# Patient Record
Sex: Male | Born: 1975
Health system: Southern US, Community
[De-identification: ages and names within clinical notes are randomized; demographics above are authoritative.]

## PROBLEM LIST (undated history)

## (undated) DIAGNOSIS — B019 Varicella without complication: Secondary | ICD-10-CM

## (undated) DIAGNOSIS — T7840XA Allergy, unspecified, initial encounter: Secondary | ICD-10-CM

## (undated) HISTORY — DX: Allergy, unspecified, initial encounter: T78.40XA

## (undated) HISTORY — DX: Varicella without complication: B01.9

---

## 2013-08-26 ENCOUNTER — Encounter (HOSPITAL_COMMUNITY): Payer: Self-pay | Admitting: Emergency Medicine

## 2013-08-26 ENCOUNTER — Emergency Department (INDEPENDENT_AMBULATORY_CARE_PROVIDER_SITE_OTHER)
Admission: EM | Admit: 2013-08-26 | Discharge: 2013-08-26 | Disposition: A | Discharge status: Home / Self Care | Payer: PRIVATE HEALTH INSURANCE | Source: Home / Self Care | Attending: Family Medicine | Admitting: Family Medicine

## 2013-08-26 DIAGNOSIS — M549 Dorsalgia, unspecified: Secondary | ICD-10-CM
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
## 2013-08-27 NOTE — ED Provider Notes (Signed)
Medical screening examination/treatment/procedure(s) were performed by resident physician or non-physician practitioner and as supervising physician I was immediately available for consultation/collaboration.   Barkley Bruns MD.   Linna Hoff, MD 08/27/13 2112

## 2013-10-15 ENCOUNTER — Encounter (HOSPITAL_COMMUNITY): Payer: Self-pay | Admitting: Emergency Medicine

## 2013-10-15 ENCOUNTER — Emergency Department (INDEPENDENT_AMBULATORY_CARE_PROVIDER_SITE_OTHER)
Admission: EM | Admit: 2013-10-15 | Discharge: 2013-10-15 | Disposition: A | Discharge status: Home / Self Care | Payer: PRIVATE HEALTH INSURANCE | Source: Home / Self Care | Attending: Emergency Medicine | Admitting: Emergency Medicine

## 2013-10-15 DIAGNOSIS — S335XXA Sprain of ligaments of lumbar spine, initial encounter: Secondary | ICD-10-CM

## 2013-10-15 DIAGNOSIS — S39012A Strain of muscle, fascia and tendon of lower back, initial encounter: Secondary | ICD-10-CM

## 2013-10-15 MED ORDER — HYDROCODONE-ACETAMINOPHEN 5-325 MG PO TABS: ORAL_TABLET | ORAL | Status: DC

## 2013-10-15 MED ORDER — IBUPROFEN 800 MG PO TABS
800.0000 mg | ORAL_TABLET | Freq: Once | ORAL | Status: AC
  Administered 2013-10-15: 800 mg via ORAL

## 2013-10-15 MED ORDER — MELOXICAM 15 MG PO TABS: 15.0000 mg | ORAL_TABLET | Freq: Every day | ORAL | Status: DC

## 2013-10-15 MED ORDER — CYCLOBENZAPRINE HCL 5 MG PO TABS: 5.0000 mg | ORAL_TABLET | Freq: Three times a day (TID) | ORAL | Status: DC | PRN

## 2013-10-15 MED ORDER — IBUPROFEN 800 MG PO TABS
ORAL_TABLET | ORAL | Status: AC
  Filled 2013-10-15: qty 1
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
## 2015-01-25 ENCOUNTER — Other Ambulatory Visit (HOSPITAL_COMMUNITY): Payer: Self-pay | Admitting: Orthopedic Surgery

## 2015-01-25 DIAGNOSIS — IMO0002 Reserved for concepts with insufficient information to code with codable children: Secondary | ICD-10-CM

## 2015-01-29 ENCOUNTER — Ambulatory Visit (HOSPITAL_COMMUNITY)
Admission: RE | Admit: 2015-01-29 | Discharge: 2015-01-29 | Disposition: A | Discharge status: Home / Self Care | Payer: Self-pay | Source: Ambulatory Visit | Attending: Orthopedic Surgery | Admitting: Orthopedic Surgery

## 2015-01-29 DIAGNOSIS — IMO0002 Reserved for concepts with insufficient information to code with codable children: Secondary | ICD-10-CM

## 2015-02-05 ENCOUNTER — Other Ambulatory Visit (HOSPITAL_COMMUNITY): Payer: Self-pay | Admitting: Orthopedic Surgery

## 2015-02-05 ENCOUNTER — Ambulatory Visit
Admission: RE | Admit: 2015-02-05 | Discharge: 2015-02-05 | Disposition: A | Discharge status: Home / Self Care | Payer: Self-pay | Source: Ambulatory Visit | Attending: Orthopedic Surgery | Admitting: Orthopedic Surgery

## 2015-02-05 ENCOUNTER — Ambulatory Visit: Admission: RE | Admit: 2015-02-05 | Payer: Self-pay | Source: Ambulatory Visit

## 2015-02-05 DIAGNOSIS — IMO0002 Reserved for concepts with insufficient information to code with codable children: Secondary | ICD-10-CM

## 2017-06-05 ENCOUNTER — Ambulatory Visit (INDEPENDENT_AMBULATORY_CARE_PROVIDER_SITE_OTHER): Payer: BLUE CROSS/BLUE SHIELD | Admitting: Primary Care

## 2017-06-05 ENCOUNTER — Encounter: Payer: Self-pay | Admitting: Primary Care

## 2017-06-05 VITALS — BP 134/98 | HR 82 | Temp 98.3°F | Ht 69.0 in | Wt 217.0 lb

## 2017-06-05 DIAGNOSIS — Z0001 Encounter for general adult medical examination with abnormal findings: Secondary | ICD-10-CM | POA: Insufficient documentation

## 2017-06-05 DIAGNOSIS — R030 Elevated blood-pressure reading, without diagnosis of hypertension: Secondary | ICD-10-CM

## 2017-06-05 DIAGNOSIS — Z0000 Encounter for general adult medical examination without abnormal findings: Secondary | ICD-10-CM | POA: Insufficient documentation

## 2017-06-05 HISTORY — DX: Elevated blood-pressure reading, without diagnosis of hypertension: R03.0

## 2017-06-05 NOTE — Patient Instructions (Signed)
Monitor your blood pressure as discussed, it should run less than 135/90.  Start exercising. You should be getting 150 minutes of moderate intensity exercise weekly.  Continue your efforts towards a healthy diet.  Schedule a lab only appointment at your convenience for your labs. Make sure you are fasting 8 hours prior to this appointment. You may have water and black coffee.  Follow up in 1 year for your annual exam or sooner if needed.  It was a pleasure to meet you today! Please don't hesitate to call me with any questions. Welcome to [facility]!

## 2017-06-05 NOTE — Progress Notes (Signed)
Subjective:    Patient ID: Julian Randolph, male    DOB: Oct 15, 1976, 41 y.o.   MRN: [id number]  HPI  Julian Randolph is a 41 year old male who presents today to establish care and for complete physical. Will obtain old records  1) Elevated Blood Pressure Reading: BP of 134/98 in the office today. No prior history of elevated readings in the past. He denies family history of hypertension and heart disease. He is working on weight loss through changes in his diet. He does not currently exercise.  Immunizations: -Tetanus: Completed in 2013 -Influenza: Did not complete last season  Diet: He endorses a poor diet up until 2 weeks ago since cutting out breads, starches, sugars. Breakfast: Skips Lunch: Eggs, bacon, meat, sandwiches Dinner: Steak, chicken, pork, rice, pasta Snacks: Fruit, chips, nuts Desserts: Occasionally Beverages: Water, beer, un-sweet tea  Exercise: He is not currently exercising Eye exam: Completed 3-4 years ago. Some mild changes over time. Dental exam: Has not completed recently   Review of Systems  Constitutional: Negative for unexpected weight change.  HENT: Negative for rhinorrhea.   Respiratory: Negative for cough and shortness of breath.   Cardiovascular: Negative for chest pain.  Gastrointestinal: Negative for constipation and diarrhea.  Genitourinary: Negative for difficulty urinating.  Musculoskeletal: Negative for arthralgias and myalgias.  Skin: Negative for rash.  Allergic/Immunologic: Negative for environmental allergies.  Neurological: Negative for dizziness, numbness and headaches.  Psychiatric/Behavioral:       Denies concerns for anxiety or depression       Past Medical History:  Diagnosis Date  . Allergy   . Chickenpox      Social History   Social History  . Marital status: Married    Spouse name: N/A  . Number of children: N/A  . Years of education: N/A   Occupational History  . Not on file.   Social History Main Topics  .  Smoking status: Former Smoker    Packs/day: 0.50    Types: Cigarettes  . Smokeless tobacco: Current User    Types: Chew  . Alcohol use Yes  . Drug use: No  . Sexual activity: Not on file   Other Topics Concern  . Not on file   Social History Narrative   Married.   2 children.   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   Enjoys spending time with family, golfing.    No past surgical history on file.  No family history on file.  No Known Allergies  No current outpatient prescriptions on file prior to visit.   No current facility-administered medications on file prior to visit.     BP (!) 134/98   Pulse 82   Temp 98.3 F (36.8 C) (Oral)   Ht 5\' 9"  (1.753 m)   Wt 217 lb (98.4 kg)   SpO2 95%   BMI 32.05 kg/m    Objective:   Physical Exam  Constitutional: He is oriented to person, place, and time. He appears well-nourished.  HENT:  Right Ear: Tympanic membrane and ear canal normal.  Left Ear: Tympanic membrane and ear canal normal.  Nose: Nose normal. Right sinus exhibits no maxillary sinus tenderness and no frontal sinus tenderness. Left sinus exhibits no maxillary sinus tenderness and no frontal sinus tenderness.  Mouth/Throat: Oropharynx is clear and moist.  Eyes: Pupils are equal, round, and reactive to light. Conjunctivae and EOM are normal.  Neck: Neck supple. Carotid bruit is not present. No thyromegaly present.  Cardiovascular: Normal rate,  regular rhythm and normal heart sounds.   Pulmonary/Chest: Effort normal and breath sounds normal. He has no wheezes. He has no rales.  Abdominal: Soft. Bowel sounds are normal. There is no tenderness.  Musculoskeletal: Normal range of motion.  Neurological: He is alert and oriented to person, place, and time. He has normal reflexes. No cranial nerve deficit.  Skin: Skin is warm and dry.  Psychiatric: He has a normal mood and affect.          Assessment & Plan:

## 2017-06-05 NOTE — Assessment & Plan Note (Signed)
Immunizations UTD. Discussed the importance of a healthy diet and regular exercise in order for weight loss, and to reduce the risk of other medical problems. Exam unremarkable. Labs pending. Will have him monitor BP readings. Follow up in 1 year for annual exam.

## 2017-06-05 NOTE — Assessment & Plan Note (Signed)
Above goal today, even on recheck. Will have him continue his weight loss efforts through healthy diet, start exercising. He will continue to monitor and notify if no improvement despite weight loss efforts.

## 2017-06-06 ENCOUNTER — Other Ambulatory Visit: Payer: Self-pay | Admitting: Primary Care

## 2017-06-06 ENCOUNTER — Other Ambulatory Visit (INDEPENDENT_AMBULATORY_CARE_PROVIDER_SITE_OTHER): Payer: BLUE CROSS/BLUE SHIELD

## 2017-06-06 DIAGNOSIS — E785 Hyperlipidemia, unspecified: Secondary | ICD-10-CM

## 2017-06-06 DIAGNOSIS — R7989 Other specified abnormal findings of blood chemistry: Secondary | ICD-10-CM | POA: Diagnosis not present

## 2017-06-06 DIAGNOSIS — Z0000 Encounter for general adult medical examination without abnormal findings: Secondary | ICD-10-CM

## 2017-06-06 LAB — COMPREHENSIVE METABOLIC PANEL
ALT: 49 U/L (ref 0–53)
AST: 34 U/L (ref 0–37)
Albumin: 4.4 g/dL (ref 3.5–5.2)
Alkaline Phosphatase: 48 U/L (ref 39–117)
BUN: 12 mg/dL (ref 6–23)
CALCIUM: 9.7 mg/dL (ref 8.4–10.5)
CO2: 27 meq/L (ref 19–32)
Chloride: 101 mEq/L (ref 96–112)
Creatinine, Ser: 1.21 mg/dL (ref 0.40–1.50)
GFR: 69.49 mL/min (ref >60.00)
GLUCOSE: 102 mg/dL — AB (ref 70–99)
Potassium: 4.6 mEq/L (ref 3.5–5.1)
Sodium: 137 mEq/L (ref 135–145)
Total Bilirubin: 1 mg/dL (ref 0.2–1.2)
Total Protein: 7.4 g/dL (ref 6.0–8.3)

## 2017-06-06 LAB — LIPID PANEL
Cholesterol: 210 mg/dL — ABNORMAL HIGH (ref 0–200)
HDL: 34.8 mg/dL — AB (ref >39.00)
Total CHOL/HDL Ratio: 6

## 2017-06-06 LAB — LDL CHOLESTEROL, DIRECT: Direct LDL: 63 mg/dL

## 2017-06-07 ENCOUNTER — Telehealth: Payer: Self-pay | Admitting: Primary Care

## 2017-06-07 NOTE — Telephone Encounter (Signed)
Pt returned missed call 

## 2017-07-20 ENCOUNTER — Other Ambulatory Visit (INDEPENDENT_AMBULATORY_CARE_PROVIDER_SITE_OTHER): Payer: BLUE CROSS/BLUE SHIELD

## 2017-07-20 ENCOUNTER — Other Ambulatory Visit: Payer: Self-pay | Admitting: Primary Care

## 2017-07-20 DIAGNOSIS — E785 Hyperlipidemia, unspecified: Secondary | ICD-10-CM

## 2017-07-20 LAB — LIPID PANEL
Cholesterol: 237 mg/dL — ABNORMAL HIGH (ref 0–200)
HDL: 34.3 mg/dL — AB (ref >39.00)
NonHDL: 202.84
Total CHOL/HDL Ratio: 7
Triglycerides: 362 mg/dL — ABNORMAL HIGH (ref 0.0–149.0)
VLDL: 72.4 mg/dL — ABNORMAL HIGH (ref 0.0–40.0)

## 2017-07-20 LAB — LDL CHOLESTEROL, DIRECT: Direct LDL: 120 mg/dL

## 2017-07-20 LAB — HEMOGLOBIN A1C: HEMOGLOBIN A1C: 5.2 % (ref 4.6–6.5)

## 2017-07-27 ENCOUNTER — Encounter: Payer: Self-pay | Admitting: *Deleted

## 2017-10-30 ENCOUNTER — Ambulatory Visit: Payer: Self-pay | Admitting: Internal Medicine

## 2018-05-09 ENCOUNTER — Other Ambulatory Visit: Payer: Self-pay | Admitting: Primary Care

## 2018-05-09 DIAGNOSIS — E785 Hyperlipidemia, unspecified: Secondary | ICD-10-CM

## 2018-05-17 ENCOUNTER — Encounter: Payer: Self-pay | Admitting: Family Medicine

## 2018-05-17 ENCOUNTER — Ambulatory Visit: Payer: Self-pay | Admitting: Family Medicine

## 2018-05-17 LAB — POCT URINALYSIS DIPSTICK
BILIRUBIN UA: NEGATIVE
Blood, UA: NEGATIVE
GLUCOSE UA: NEGATIVE
Ketones, UA: NEGATIVE
Leukocytes, UA: NEGATIVE
Nitrite, UA: NEGATIVE
Protein, UA: NEGATIVE
SPEC GRAV UA: 1.01 (ref 1.010–1.025)
UROBILINOGEN UA: 0.2 U/dL
pH, UA: 6.5 (ref 5.0–8.0)

## 2018-05-28 NOTE — Progress Notes (Signed)
Erroneous

## 2018-06-04 ENCOUNTER — Telehealth: Payer: Self-pay | Admitting: Primary Care

## 2018-06-04 ENCOUNTER — Other Ambulatory Visit (INDEPENDENT_AMBULATORY_CARE_PROVIDER_SITE_OTHER): Payer: 59

## 2018-06-04 DIAGNOSIS — E785 Hyperlipidemia, unspecified: Secondary | ICD-10-CM | POA: Diagnosis not present

## 2018-06-04 LAB — COMPREHENSIVE METABOLIC PANEL
ALT: 27 U/L (ref 0–53)
AST: 46 U/L — ABNORMAL HIGH (ref 0–37)
Albumin: 4.5 g/dL (ref 3.5–5.2)
Alkaline Phosphatase: 44 U/L (ref 39–117)
BUN: 13 mg/dL (ref 6–23)
CALCIUM: 9.5 mg/dL (ref 8.4–10.5)
CHLORIDE: 100 meq/L (ref 96–112)
CO2: 29 mEq/L (ref 19–32)
Creatinine, Ser: 1.12 mg/dL (ref 0.40–1.50)
GFR: 75.62 mL/min (ref >60.00)
Glucose, Bld: 95 mg/dL (ref 70–99)
Potassium: 4.2 mEq/L (ref 3.5–5.1)
SODIUM: 138 meq/L (ref 135–145)
TOTAL PROTEIN: 7.4 g/dL (ref 6.0–8.3)
Total Bilirubin: 1.3 mg/dL — ABNORMAL HIGH (ref 0.2–1.2)

## 2018-06-04 LAB — LIPID PANEL
CHOLESTEROL: 238 mg/dL — AB (ref 0–200)
HDL: 55.2 mg/dL (ref >39.00)
NonHDL: 182.75
Total CHOL/HDL Ratio: 4
Triglycerides: 223 mg/dL — ABNORMAL HIGH (ref 0.0–149.0)
VLDL: 44.6 mg/dL — AB (ref 0.0–40.0)

## 2018-06-04 LAB — LDL CHOLESTEROL, DIRECT: Direct LDL: 154 mg/dL

## 2018-06-04 NOTE — Telephone Encounter (Signed)
Yes, we can. Please schedule at his convenience. Thanks

## 2018-06-04 NOTE — Telephone Encounter (Signed)
Appointment 7/29

## 2018-06-04 NOTE — Telephone Encounter (Signed)
Pt had cpx labs today and had to r/s his cpx for 7/18.  PEC scheduled this in sept.  Can pt be worked in sooner?

## 2018-06-06 ENCOUNTER — Encounter: Payer: BLUE CROSS/BLUE SHIELD | Admitting: Primary Care

## 2018-06-17 ENCOUNTER — Ambulatory Visit (INDEPENDENT_AMBULATORY_CARE_PROVIDER_SITE_OTHER): Payer: 59 | Admitting: Primary Care

## 2018-06-17 ENCOUNTER — Encounter: Payer: Self-pay | Admitting: Primary Care

## 2018-06-17 VITALS — BP 104/60 | HR 72 | Temp 98.1°F | Ht 68.0 in | Wt 184.0 lb

## 2018-06-17 DIAGNOSIS — Z0000 Encounter for general adult medical examination without abnormal findings: Secondary | ICD-10-CM

## 2018-06-17 DIAGNOSIS — E785 Hyperlipidemia, unspecified: Secondary | ICD-10-CM | POA: Insufficient documentation

## 2018-06-17 DIAGNOSIS — E782 Mixed hyperlipidemia: Secondary | ICD-10-CM | POA: Diagnosis not present

## 2018-06-17 DIAGNOSIS — R030 Elevated blood-pressure reading, without diagnosis of hypertension: Secondary | ICD-10-CM

## 2018-06-17 NOTE — Patient Instructions (Signed)
Congratulations on your weight loss, you look great!  Resume Fish Oil 1000 mg once daily for triglycerides.  Continue exercising. You should be getting 150 minutes of moderate intensity exercise weekly.  Ensure you are consuming 64 ounces of water daily.  Schedule a lab only appointment in 3 months so we can recheck your liver enzymes.  Follow up in 1 year for your annual exam or sooner if needed.  It was a pleasure to see you today!   Preventive Care 40-64 Years, Male Preventive care refers to lifestyle choices and visits with your health care provider that can promote health and wellness. What does preventive care include?  A yearly physical exam. This is also called an annual well check.  Dental exams once or twice a year.  Routine eye exams. Ask your health care provider how often you should have your eyes checked.  Personal lifestyle choices, including: ? Daily care of your teeth and gums. ? Regular physical activity. ? Eating a healthy diet. ? Avoiding tobacco and drug use. ? Limiting alcohol use. ? Practicing safe sex. ? Taking low-dose aspirin every day starting at age 71. What happens during an annual well check? The services and screenings done by your health care provider during your annual well check will depend on your age, overall health, lifestyle risk factors, and family history of disease. Counseling Your health care provider may ask you questions about your:  Alcohol use.  Tobacco use.  Drug use.  Emotional well-being.  Home and relationship well-being.  Sexual activity.  Eating habits.  Work and work Statistician.  Screening You may have the following tests or measurements:  Height, weight, and BMI.  Blood pressure.  Lipid and cholesterol levels. These may be checked every 5 years, or more frequently if you are over 1 years old.  Skin check.  Lung cancer screening. You may have this screening every year starting at age 63 if you have  a 30-pack-year history of smoking and currently smoke or have quit within the past 15 years.  Fecal occult blood test (FOBT) of the stool. You may have this test every year starting at age 44.  Flexible sigmoidoscopy or colonoscopy. You may have a sigmoidoscopy every 5 years or a colonoscopy every 10 years starting at age 34.  Prostate cancer screening. Recommendations will vary depending on your family history and other risks.  Hepatitis C blood test.  Hepatitis B blood test.  Sexually transmitted disease (STD) testing.  Diabetes screening. This is done by checking your blood sugar (glucose) after you have not eaten for a while (fasting). You may have this done every 1-3 years.  Discuss your test results, treatment options, and if necessary, the need for more tests with your health care provider. Vaccines Your health care provider may recommend certain vaccines, such as:  Influenza vaccine. This is recommended every year.  Tetanus, diphtheria, and acellular pertussis (Tdap, Td) vaccine. You may need a Td booster every 10 years.  Varicella vaccine. You may need this if you have not been vaccinated.  Zoster vaccine. You may need this after age 44.  Measles, mumps, and rubella (MMR) vaccine. You may need at least one dose of MMR if you were born in 1957 or later. You may also need a second dose.  Pneumococcal 13-valent conjugate (PCV13) vaccine. You may need this if you have certain conditions and have not been vaccinated.  Pneumococcal polysaccharide (PPSV23) vaccine. You may need one or two doses if you smoke cigarettes  or if you have certain conditions.  Meningococcal vaccine. You may need this if you have certain conditions.  Hepatitis A vaccine. You may need this if you have certain conditions or if you travel or work in places where you may be exposed to hepatitis A.  Hepatitis B vaccine. You may need this if you have certain conditions or if you travel or work in places  where you may be exposed to hepatitis B.  Haemophilus influenzae type b (Hib) vaccine. You may need this if you have certain risk factors.  Talk to your health care provider about which screenings and vaccines you need and how often you need them. This information is not intended to replace advice given to you by your health care provider. Make sure you discuss any questions you have with your health care provider. Document Released: 12/03/2015 Document Revised: 07/26/2016 Document Reviewed: 09/07/2015 Elsevier Interactive Patient Education  [employer].

## 2018-06-17 NOTE — Assessment & Plan Note (Signed)
Td UTD. Commended him on weight loss through diet, did recommend regular exercise.  Exam unremarkable. Labs reviewed. Repeat liver enzymes in 3 months. Cholesterol overall improved. Follow up in 1 year for CPE.

## 2018-06-17 NOTE — Assessment & Plan Note (Signed)
Normotensive today. Suspect secondary to weight loss, commended him on this. Continue to monitor.

## 2018-06-17 NOTE — Assessment & Plan Note (Signed)
LDL worse, HDL improved, Trigs improved. Suspect LDL worse due to ketogenic/high protein diet. Overall improved and will continue to monitor. Recommended to increase aerobic exercise.

## 2018-06-17 NOTE — Progress Notes (Signed)
Subjective:    Patient ID: Julian Randolph, male    DOB: Oct 18, 1976, 42 y.o.   MRN: [id number]  HPI  Julian Randolph is a 42 year old male who presents today for complete physical.  Immunizations: -Tetanus: Completed in 2016 -Influenza: Did not complete last season   Diet: He endorses a healthy diet. Has been doing keto and no carb diet. Breakfast: Skips Lunch: Bacon, eggs, steak, beef, chicken, fish,  Dinner: Meat, vegetables  Snacks: Infrequently, fruit, nuts Desserts: None Beverages: Coffee, water, green tea, several times weekly alcohol  Exercise: He is doing stretching and weights at home, 3 days weekly.  Eye exam: No recent formal exam, using readers.  Dental exam: Completes semi-annually   BP Readings from Last 3 Encounters:  06/17/18 104/60  05/17/18 113/67  06/05/17 (!) 134/98    Wt Readings from Last 3 Encounters:  06/17/18 184 lb (83.5 kg)  05/17/18 182 lb (82.6 kg)  06/05/17 217 lb (98.4 kg)     Review of Systems  Constitutional: Negative for unexpected weight change.  HENT: Negative for rhinorrhea.   Respiratory: Negative for cough and shortness of breath.   Cardiovascular: Negative for chest pain.  Gastrointestinal: Negative for constipation and diarrhea.  Genitourinary: Negative for difficulty urinating.  Musculoskeletal: Negative for arthralgias and myalgias.  Skin: Negative for rash.  Allergic/Immunologic: Negative for environmental allergies.  Neurological: Negative for dizziness, numbness and headaches.  Psychiatric/Behavioral: The patient is not nervous/anxious.        Past Medical History:  Diagnosis Date  . Allergy   . Chickenpox      Social History   Socioeconomic History  . Marital status: Married    Spouse name: Not on file  . Number of children: Not on file  . Years of education: Not on file  . Highest education level: Not on file  Occupational History  . Not on file  Social Needs  . Financial resource strain: Not on  file  . Food insecurity:    Worry: Not on file    Inability: Not on file  . Transportation needs:    Medical: Not on file    Non-medical: Not on file  Tobacco Use  . Smoking status: Former Smoker    Packs/day: 0.50    Types: Cigarettes  . Smokeless tobacco: Current User    Types: Chew  Substance and Sexual Activity  . Alcohol use: Yes  . Drug use: No  . Sexual activity: Not on file  Lifestyle  . Physical activity:    Days per week: Not on file    Minutes per session: Not on file  . Stress: Not on file  Relationships  . Social connections:    Talks on phone: Not on file    Gets together: Not on file    Attends religious service: Not on file    Active member of club or organization: Not on file    Attends meetings of clubs or organizations: Not on file    Relationship status: Not on file  . Intimate partner violence:    Fear of current or ex partner: Not on file    Emotionally abused: Not on file    Physically abused: Not on file    Forced sexual activity: Not on file  Other Topics Concern  . Not on file  Social History Narrative   Married.   2 children.   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   Enjoys spending time with family, golfing.  No past surgical history on file.  No family history on file.  No Known Allergies  No current outpatient medications on file prior to visit.   No current facility-administered medications on file prior to visit.     BP 104/60   Pulse 72   Temp 98.1 F (36.7 C) (Oral)   Ht 5\' 8"  (1.727 m)   Wt 184 lb (83.5 kg)   SpO2 97%   BMI 27.98 kg/m    Objective:   Physical Exam  Constitutional: He is oriented to person, place, and time. He appears well-nourished.  HENT:  Mouth/Throat: No oropharyngeal exudate.  Eyes: Pupils are equal, round, and reactive to light. EOM are normal.  Neck: Neck supple. No thyromegaly present.  Cardiovascular: Normal rate and regular rhythm.  Respiratory: Effort normal and  breath sounds normal.  GI: Soft. Bowel sounds are normal. There is no tenderness.  Musculoskeletal: Normal range of motion.  Neurological: He is alert and oriented to person, place, and time.  Skin: Skin is warm and dry.  Psychiatric: He has a normal mood and affect.           Assessment & Plan:

## 2018-07-09 ENCOUNTER — Ambulatory Visit
Admission: EM | Admit: 2018-07-09 | Discharge: 2018-07-09 | Disposition: A | Discharge status: Home / Self Care | Payer: 59 | Attending: Family Medicine | Admitting: Family Medicine

## 2018-07-09 ENCOUNTER — Other Ambulatory Visit: Payer: Self-pay

## 2018-07-09 ENCOUNTER — Encounter: Payer: Self-pay | Admitting: Emergency Medicine

## 2018-07-09 DIAGNOSIS — L0231 Cutaneous abscess of buttock: Secondary | ICD-10-CM | POA: Diagnosis not present

## 2018-07-09 MED ORDER — DOXYCYCLINE HYCLATE 100 MG PO CAPS: 100.0000 mg | ORAL_CAPSULE | Freq: Two times a day (BID) | ORAL | 0 refills | Status: DC
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
## 2018-07-09 NOTE — ED Provider Notes (Signed)
[facility]   CSN: [id number] Arrival date & time: 07/09/18  1342  History   Chief Complaint Chief Complaint  Patient presents with  . Recurrent Skin Infections   HPI  42 year old male presents with a buttock abscess.   Patient reports that he had a bump on his right buttock for the past 2 months.  He states that he squeezed the area approximately 10 days ago.  It has been worsening since.  He states that it is red, swollen, and painful.  Mild to moderate pain.  Worse when sitting..  No medications or interventions tried.  No known relieving factors.  No other complaints.  Past Medical History:  Diagnosis Date  . Allergy   . Chickenpox    Patient Active Problem List   Diagnosis Date Noted  . Hyperlipidemia 06/17/2018  . Preventative health care 06/05/2017  . Elevated blood pressure reading 06/05/2017   History reviewed. No pertinent surgical history.  Home Medications    Prior to Admission medications   Medication Sig Start Date End Date Taking? Authorizing Provider  doxycycline (VIBRAMYCIN) 100 MG capsule Take 1 capsule (100 mg total) by mouth 2 (two) times daily. 07/09/18   Coral Spikes, DO   Family History Family History  Problem Relation Age of Onset  . Healthy Mother   . Healthy Father    Social History Social History   Tobacco Use  . Smoking status: Former Smoker    Packs/day: 0.50    Types: Cigarettes  . Smokeless tobacco: Former Systems developer    Types: Chew  Substance Use Topics  . Alcohol use: Yes    Comment: socially  . Drug use: No   Allergies   Patient has no known allergies.  Review of Systems Review of Systems  Constitutional: Negative.   Skin:       Right buttock abscess.    Physical Exam Triage Vital Signs ED Triage Vitals  Enc Vitals Group     BP 07/09/18 1352 (!) 114/94     Pulse Rate 07/09/18 1352 91     Resp 07/09/18 1352 16     Temp 07/09/18 1352 98 F (36.7 C)     Temp Source 07/09/18 1352 Oral     SpO2 07/09/18 1352  100 %     Weight 07/09/18 1350 180 lb (81.6 kg)     Height 07/09/18 1350 5\' 9"  (1.753 m)     Head Circumference --      Peak Flow --      Pain Score 07/09/18 1350 4     Pain Loc --      Pain Edu? --      Excl. in [place]? --    Updated Vital Signs BP (!) 114/94 (BP Location: Left Arm)   Pulse 91   Temp 98 F (36.7 C) (Oral)   Resp 16   Ht 5\' 9"  (1.753 m)   Wt 81.6 kg   SpO2 100%   BMI 26.58 kg/m   Visual Acuity Right Eye Distance:   Left Eye Distance:   Bilateral Distance:    Right Eye Near:   Left Eye Near:    Bilateral Near:     Physical Exam  Constitutional: He is oriented to person, place, and time. He appears well-developed. No distress.  HENT:  Head: Normocephalic and atraumatic.  Pulmonary/Chest: Effort normal. No respiratory distress.  Neurological: He is alert and oriented to person, place, and time.  Skin:  Right buttock - 5 x  2.5 cm abscess. Drained with minimal pressure today (moderate purulence).   Psychiatric: He has a normal mood and affect. His behavior is normal.  Nursing note and vitals reviewed.  UC Treatments / Results  Labs (all labs ordered are listed, but only abnormal results are displayed) Labs Reviewed - No data to display  EKG None  Radiology No results found.  Procedures Procedures (including critical care time)  Medications Ordered in UC Medications - No data to display  Initial Impression / Assessment and Plan / UC Course  I have reviewed the triage vital signs and the nursing notes.  Pertinent labs & imaging results that were available during my care of the patient were reviewed by me and considered in my medical decision making (see chart for details).    42 year old male presents with a right buttock abscess.  Area drained today with minimal pressure.  No need for incision and drainage.  Wound dressed.  Treating with doxycycline.  Final Clinical Impressions(s) / UC Diagnoses   Final diagnoses:  Abscess of right buttock     Discharge Instructions   None    ED Prescriptions    Medication Sig Dispense Auth. Provider   doxycycline (VIBRAMYCIN) 100 MG capsule Take 1 capsule (100 mg total) by mouth 2 (two) times daily. 14 capsule Coral Spikes, DO     Controlled Substance Prescriptions [place] Controlled Substance Registry consulted? Not Applicable   Coral Spikes, DO 07/09/18 1457

## 2018-07-31 ENCOUNTER — Encounter: Payer: Self-pay | Admitting: Primary Care

## 2018-09-10 ENCOUNTER — Other Ambulatory Visit: Payer: Self-pay | Admitting: Primary Care

## 2018-09-10 DIAGNOSIS — R945 Abnormal results of liver function studies: Principal | ICD-10-CM

## 2018-09-10 DIAGNOSIS — R7989 Other specified abnormal findings of blood chemistry: Secondary | ICD-10-CM

## 2018-09-17 ENCOUNTER — Other Ambulatory Visit (INDEPENDENT_AMBULATORY_CARE_PROVIDER_SITE_OTHER): Payer: 59

## 2018-09-17 DIAGNOSIS — R945 Abnormal results of liver function studies: Secondary | ICD-10-CM

## 2018-09-17 DIAGNOSIS — R7989 Other specified abnormal findings of blood chemistry: Secondary | ICD-10-CM

## 2018-09-17 LAB — HEPATIC FUNCTION PANEL
ALK PHOS: 43 U/L (ref 39–117)
ALT: 14 U/L (ref 0–53)
AST: 16 U/L (ref 0–37)
Albumin: 4.3 g/dL (ref 3.5–5.2)
Bilirubin, Direct: 0.1 mg/dL (ref 0.0–0.3)
Total Bilirubin: 0.6 mg/dL (ref 0.2–1.2)
Total Protein: 6.8 g/dL (ref 6.0–8.3)

## 2018-10-10 ENCOUNTER — Ambulatory Visit (INDEPENDENT_AMBULATORY_CARE_PROVIDER_SITE_OTHER)
Admission: RE | Admit: 2018-10-10 | Discharge: 2018-10-10 | Disposition: A | Discharge status: Home / Self Care | Payer: 59 | Source: Ambulatory Visit | Attending: Primary Care | Admitting: Primary Care

## 2018-10-10 ENCOUNTER — Ambulatory Visit (INDEPENDENT_AMBULATORY_CARE_PROVIDER_SITE_OTHER): Payer: 59 | Admitting: Primary Care

## 2018-10-10 ENCOUNTER — Encounter: Payer: Self-pay | Admitting: Primary Care

## 2018-10-10 VITALS — BP 110/74 | HR 85 | Temp 98.1°F | Ht 68.0 in | Wt 180.2 lb

## 2018-10-10 DIAGNOSIS — R1032 Left lower quadrant pain: Secondary | ICD-10-CM

## 2018-10-10 DIAGNOSIS — G8929 Other chronic pain: Secondary | ICD-10-CM | POA: Insufficient documentation

## 2018-10-10 LAB — CBC WITH DIFFERENTIAL/PLATELET
Basophils Absolute: 0 10*3/uL (ref 0.0–0.1)
Basophils Relative: 1.1 % (ref 0.0–3.0)
Eosinophils Absolute: 0.1 10*3/uL (ref 0.0–0.7)
Eosinophils Relative: 2.3 % (ref 0.0–5.0)
HCT: 43.4 % (ref 39.0–52.0)
Hemoglobin: 14.9 g/dL (ref 13.0–17.0)
LYMPHS PCT: 32.7 % (ref 12.0–46.0)
Lymphs Abs: 1.5 10*3/uL (ref 0.7–4.0)
MCHC: 34.3 g/dL (ref 30.0–36.0)
MCV: 89 fl (ref 78.0–100.0)
MONOS PCT: 8.3 % (ref 3.0–12.0)
Monocytes Absolute: 0.4 10*3/uL (ref 0.1–1.0)
Neutro Abs: 2.6 10*3/uL (ref 1.4–7.7)
Neutrophils Relative %: 55.6 % (ref 43.0–77.0)
Platelets: 168 10*3/uL (ref 150.0–400.0)
RBC: 4.87 Mil/uL (ref 4.22–5.81)
RDW: 13.4 % (ref 11.5–15.5)
WBC: 4.7 10*3/uL (ref 4.0–10.5)

## 2018-10-10 LAB — BASIC METABOLIC PANEL
BUN: 11 mg/dL (ref 6–23)
CHLORIDE: 101 meq/L (ref 96–112)
CO2: 25 mEq/L (ref 19–32)
CREATININE: 1.07 mg/dL (ref 0.40–1.50)
Calcium: 9.7 mg/dL (ref 8.4–10.5)
GFR: 79.58 mL/min (ref >60.00)
Glucose, Bld: 66 mg/dL — ABNORMAL LOW (ref 70–99)
Potassium: 4.3 mEq/L (ref 3.5–5.1)
Sodium: 138 mEq/L (ref 135–145)

## 2018-10-10 NOTE — Patient Instructions (Signed)
Stop by the lab prior to leaving today. I will notify you of your results once received.   Continue to reduce your alcohol consumption. Increase vegetables, fruit, whole grains, lean protein.   Please notify me if you develop nausea/vomiting, blood in your stools.   It was a pleasure to see you today!

## 2018-10-10 NOTE — Progress Notes (Signed)
Subjective:    Patient ID: Julian Randolph, male    DOB: 12-Aug-1976, 42 y.o.   MRN: [id number]  HPI  Julian Randolph is a 42 year old male who presents today with a chief complaint of abdominal pain.   His pain is located to the LLQ which is more of a discomfort/dull ache that has been present for the last several months. His discomfort is intermittent, daily and denies his discomfort at present. He denies nausea, constipation, diarrhea, bloody stools, unexplained weight loss, heavy lifting, urinary symptoms. He has noticed improvement in symptoms since he's cut back on alcohol consumption.   He has a family history of colon cancer in his maternal uncle who was diagnosed at age 69. He is worried that he may have colon cancer.  Diet currently consists of:  Breakfast: Skips Lunch: Steak, ground beef, fish, eggs, no vegetables or starch Dinner: Steak, ground beef, fish, no vegetables or starch  Snacks: Fruit  Desserts: None Beverages: Water, coffee, beer (6 pack 2-3 times weekly, nothing in the last 2 weeks)   Exercise: He does not exercise regularly.    Review of Systems  Constitutional: Negative for fever and unexpected weight change.  Gastrointestinal: Positive for abdominal pain. Negative for blood in stool, constipation, diarrhea, nausea and vomiting.  Genitourinary: Negative for dysuria, frequency and hematuria.  Neurological: Negative for dizziness.       Past Medical History:  Diagnosis Date  . Allergy   . Chickenpox      Social History   Socioeconomic History  . Marital status: Married    Spouse name: Not on file  . Number of children: Not on file  . Years of education: Not on file  . Highest education level: Not on file  Occupational History  . Not on file  Social Needs  . Financial resource strain: Not on file  . Food insecurity:    Worry: Not on file    Inability: Not on file  . Transportation needs:    Medical: Not on file    Non-medical: Not on file    Tobacco Use  . Smoking status: Former Smoker    Packs/day: 0.50    Types: Cigarettes  . Smokeless tobacco: Former Systems developer    Types: Chew  Substance and Sexual Activity  . Alcohol use: Yes    Comment: socially  . Drug use: No  . Sexual activity: Not on file  Lifestyle  . Physical activity:    Days per week: Not on file    Minutes per session: Not on file  . Stress: Not on file  Relationships  . Social connections:    Talks on phone: Not on file    Gets together: Not on file    Attends religious service: Not on file    Active member of club or organization: Not on file    Attends meetings of clubs or organizations: Not on file    Relationship status: Not on file  . Intimate partner violence:    Fear of current or ex partner: Not on file    Emotionally abused: Not on file    Physically abused: Not on file    Forced sexual activity: Not on file  Other Topics Concern  . Not on file  Social History Narrative   Married.   2 children.   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   Enjoys spending time with family, golfing.    No past surgical history on file.  Family  History  Problem Relation Age of Onset  . Healthy Mother   . Healthy Father   . Colon cancer Maternal Uncle 70    No Known Allergies  No current outpatient medications on file prior to visit.   No current facility-administered medications on file prior to visit.     BP 110/74   Pulse 85   Temp 98.1 F (36.7 C) (Oral)   Ht 5\' 8"  (1.727 m)   Wt 180 lb 4 oz (81.8 kg)   SpO2 97%   BMI 27.41 kg/m    Objective:   Physical Exam  Constitutional: He appears well-nourished.  Neck: Neck supple.  Cardiovascular: Normal rate.  Respiratory: Effort normal.  GI: Soft. Bowel sounds are normal. There is no tenderness.  Skin: Skin is warm and dry.           Assessment & Plan:

## 2018-10-10 NOTE — Assessment & Plan Note (Signed)
Intermittent x 2-3 months. No other symptoms. Exam today unremarkable.   Start with labs and abdominal plain films. LFT's one month ago improved and normal  Doubt colon cancer given lack of cardinal signs/symptoms. He does not appear sickly. Could be MSK related, or possibly some inflammation from ETOH abuse.  Await results.

## 2018-10-25 DIAGNOSIS — R1032 Left lower quadrant pain: Secondary | ICD-10-CM

## 2018-10-29 ENCOUNTER — Ambulatory Visit
Admission: RE | Admit: 2018-10-29 | Discharge: 2018-10-29 | Disposition: A | Discharge status: Home / Self Care | Payer: 59 | Source: Ambulatory Visit | Attending: Primary Care | Admitting: Primary Care

## 2018-10-29 DIAGNOSIS — R1032 Left lower quadrant pain: Secondary | ICD-10-CM

## 2018-10-29 DIAGNOSIS — N329 Bladder disorder, unspecified: Secondary | ICD-10-CM | POA: Diagnosis not present

## 2018-10-29 MED ORDER — IOPAMIDOL (ISOVUE-300) INJECTION 61%
100.0000 mL | Freq: Once | INTRAVENOUS | Status: AC | PRN
  Administered 2018-10-29: 100 mL via INTRAVENOUS

## 2019-11-26 ENCOUNTER — Telehealth (INDEPENDENT_AMBULATORY_CARE_PROVIDER_SITE_OTHER): Payer: Self-pay | Admitting: Primary Care

## 2019-11-26 ENCOUNTER — Encounter: Payer: Self-pay | Admitting: Primary Care

## 2019-11-26 ENCOUNTER — Other Ambulatory Visit: Payer: Self-pay

## 2019-11-26 DIAGNOSIS — R109 Unspecified abdominal pain: Secondary | ICD-10-CM

## 2019-11-26 DIAGNOSIS — G8929 Other chronic pain: Secondary | ICD-10-CM

## 2019-11-26 NOTE — Progress Notes (Signed)
Subjective:    Patient ID: Julian Randolph, male    DOB: 30-Jul-1976, 44 y.o.   MRN: [id number]  HPI   Virtual Visit via Video Note  I connected with Julian Randolph on 11/26/19 at  8:00 AM EST by a video enabled telemedicine application and verified that I am speaking with the correct person using two identifiers.  Location: Patient: Home Provider: Office   I discussed the limitations of evaluation and management by telemedicine and the availability of in person appointments. The patient expressed understanding and agreed to proceed.  We attempted to connect via video but his camera wouldn't allow access. We conducted our call via phone which lasted 11 min and 8 sec.  History of Present Illness:  [name] is a 44 year old male with a history of LLQ abdominal pain, elevated blood pressure reading, hyperlipidemia who presents today with a chief complaint of abdominal pain.  Today he endorses ongoing, daily, "knawing" LUQ and LLQ abdominal pain that has been chronic for over one year. He was last evaluated in December 2019 for same symptoms, work up without obvious cause including CT scan and labs.  Over the last week he's working to eliminate acidic foods and spicy foods and has noticed improvement. He denies bloody stools, unexplained weight loss, nausea, vomiting, fatigue.   He has been taking Pepcid and Carafate intermittently with temporary improvement. He has never undergone colonoscopy.   Observations/Objective:  Alert and oriented. Sounds well. No distress. Speaking in complete sentences.  Assessment and Plan:  Chronic left sided abdominal pain, now seems to me more LUQ rather than LLQ. Work up from 2019 unremarkable. Given LUQ pain we will check for H pylori involvement. No PPI use. Also check CBC and CMP. Referral placed to GI for colonoscopy and potential endoscopy if needed. Await results.   Follow Up Instructions:  Call the main line to set up a lab  appointment as discussed.  You will be contacted regarding your referral to GI.  Please let us know if you have not been contacted within two weeks.   It was a pleasure to see you today! [name], NP-C    I discussed the assessment and treatment plan with the patient. The patient was provided an opportunity to ask questions and all were answered. The patient agreed with the plan and demonstrated an understanding of the instructions.   The patient was advised to call back or seek an in-person evaluation if the symptoms worsen or if the condition fails to improve as anticipated.   [name], NP   Review of Systems  Constitutional: Negative for fever and unexpected weight change.  Respiratory: Negative for shortness of breath.   Cardiovascular: Negative for chest pain.  Gastrointestinal: Positive for abdominal pain. Negative for blood in stool, constipation, diarrhea, nausea and vomiting.  Neurological: Negative for dizziness.       Past Medical History:  Diagnosis Date  . Allergy   . Chickenpox      Social History   Socioeconomic History  . Marital status: Married    Spouse name: Not on file  . Number of children: Not on file  . Years of education: Not on file  . Highest education level: Not on file  Occupational History  . Not on file  Tobacco Use  . Smoking status: Former Smoker    Packs/day: 0.50    Types: Cigarettes  . Smokeless tobacco: Former Systems developer    Types: Chew  Substance and Sexual Activity  .  Alcohol use: Yes    Comment: socially  . Drug use: No  . Sexual activity: Not on file  Other Topics Concern  . Not on file  Social History Narrative   Married.   2 children.   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   Enjoys spending time with family, golfing.   Social Determinants of Health   Financial Resource Strain:   . Difficulty of Paying Living Expenses: Not on file  Food Insecurity:   . Worried About Charity fundraiser in the  Last Year: Not on file  . Ran Out of Food in the Last Year: Not on file  Transportation Needs:   . Lack of Transportation (Medical): Not on file  . Lack of Transportation (Non-Medical): Not on file  Physical Activity:   . Days of Exercise per Week: Not on file  . Minutes of Exercise per Session: Not on file  Stress:   . Feeling of Stress : Not on file  Social Connections:   . Frequency of Communication with Friends and Family: Not on file  . Frequency of Social Gatherings with Friends and Family: Not on file  . Attends Religious Services: Not on file  . Active Member of Clubs or Organizations: Not on file  . Attends Archivist Meetings: Not on file  . Marital Status: Not on file  Intimate Partner Violence:   . Fear of Current or Ex-Partner: Not on file  . Emotionally Abused: Not on file  . Physically Abused: Not on file  . Sexually Abused: Not on file    No past surgical history on file.  Family History  Problem Relation Age of Onset  . Healthy Mother   . Healthy Father   . Colon cancer Maternal Uncle 70    No Known Allergies  No current outpatient medications on file prior to visit.   No current facility-administered medications on file prior to visit.    BP 112/78   Pulse 76   Wt 180 lb (81.6 kg)   BMI 27.37 kg/m    Objective:   Physical Exam  Constitutional: He is oriented to person, place, and time.  Respiratory: Effort normal.  Neurological: He is alert and oriented to person, place, and time.  Psychiatric: He has a normal mood and affect.           Assessment & Plan:

## 2019-11-26 NOTE — Assessment & Plan Note (Signed)
Chronic left sided abdominal pain, now seems to me more LUQ rather than LLQ. Work up from 2019 unremarkable. Given LUQ pain we will check for H pylori involvement. No PPI use. Also check CBC and CMP. Referral placed to GI for colonoscopy and potential endoscopy if needed. Await results.

## 2019-11-26 NOTE — Patient Instructions (Signed)
Call the main line to set up a lab appointment as discussed.  You will be contacted regarding your referral to GI.  Please let us know if you have not been contacted within two weeks.   It was a pleasure to see you today! [name], NP-C

## 2019-11-28 ENCOUNTER — Other Ambulatory Visit (INDEPENDENT_AMBULATORY_CARE_PROVIDER_SITE_OTHER): Payer: Self-pay

## 2019-11-28 DIAGNOSIS — G8929 Other chronic pain: Secondary | ICD-10-CM

## 2019-11-28 DIAGNOSIS — R109 Unspecified abdominal pain: Secondary | ICD-10-CM

## 2019-12-01 LAB — HELICOBACTER PYLORI  SPECIAL ANTIGEN
MICRO NUMBER:: 10022733
SPECIMEN QUALITY: ADEQUATE

## 2019-12-17 NOTE — Telephone Encounter (Signed)
Pt called and scheduled his lab appt for tomorrow. There were no labs ordered. He said Anda Kraft had told him to have labs done. He is late doing it.

## 2019-12-17 NOTE — Telephone Encounter (Signed)
Yes, there are labs ordered and dated from 11/26/19.

## 2019-12-18 ENCOUNTER — Other Ambulatory Visit: Payer: Self-pay

## 2019-12-18 ENCOUNTER — Telehealth: Payer: Self-pay | Admitting: Primary Care

## 2019-12-18 ENCOUNTER — Other Ambulatory Visit (INDEPENDENT_AMBULATORY_CARE_PROVIDER_SITE_OTHER): Payer: No Typology Code available for payment source

## 2019-12-18 DIAGNOSIS — G8929 Other chronic pain: Secondary | ICD-10-CM

## 2019-12-18 DIAGNOSIS — R109 Unspecified abdominal pain: Secondary | ICD-10-CM | POA: Diagnosis not present

## 2019-12-18 LAB — COMPREHENSIVE METABOLIC PANEL
ALT: 16 U/L (ref 0–53)
AST: 15 U/L (ref 0–37)
Albumin: 4.4 g/dL (ref 3.5–5.2)
Alkaline Phosphatase: 46 U/L (ref 39–117)
BUN: 10 mg/dL (ref 6–23)
CO2: 31 mEq/L (ref 19–32)
Calcium: 9.7 mg/dL (ref 8.4–10.5)
Chloride: 104 mEq/L (ref 96–112)
Creatinine, Ser: 1.03 mg/dL (ref 0.40–1.50)
GFR: 77.83 mL/min (ref >60.00)
Glucose, Bld: 101 mg/dL — ABNORMAL HIGH (ref 70–99)
Potassium: 4.6 mEq/L (ref 3.5–5.1)
Sodium: 140 mEq/L (ref 135–145)
Total Bilirubin: 0.7 mg/dL (ref 0.2–1.2)
Total Protein: 6.7 g/dL (ref 6.0–8.3)

## 2019-12-18 LAB — CBC WITH DIFFERENTIAL/PLATELET
Basophils Absolute: 0.1 10*3/uL (ref 0.0–0.1)
Basophils Relative: 1.2 % (ref 0.0–3.0)
Eosinophils Absolute: 0.3 10*3/uL (ref 0.0–0.7)
Eosinophils Relative: 7 % — ABNORMAL HIGH (ref 0.0–5.0)
HCT: 42.8 % (ref 39.0–52.0)
Hemoglobin: 14.5 g/dL (ref 13.0–17.0)
Lymphocytes Relative: 38 % (ref 12.0–46.0)
Lymphs Abs: 1.9 10*3/uL (ref 0.7–4.0)
MCHC: 33.8 g/dL (ref 30.0–36.0)
MCV: 88.4 fl (ref 78.0–100.0)
Monocytes Absolute: 0.4 10*3/uL (ref 0.1–1.0)
Monocytes Relative: 7.8 % (ref 3.0–12.0)
Neutro Abs: 2.3 10*3/uL (ref 1.4–7.7)
Neutrophils Relative %: 46 % (ref 43.0–77.0)
Platelets: 202 10*3/uL (ref 150.0–400.0)
RBC: 4.84 Mil/uL (ref 4.22–5.81)
RDW: 13.5 % (ref 11.5–15.5)
WBC: 5 10*3/uL (ref 4.0–10.5)

## 2019-12-18 NOTE — Telephone Encounter (Signed)
Pt said he will come get it.

## 2019-12-18 NOTE — Telephone Encounter (Signed)
Left message asking pt to call office  I have pt insurance card does he want [place] to mail it to him or him come pick it up.  Up front  In yellow folders  If pt wants it mailed please put in out going mail And document pt called

## 2019-12-19 DIAGNOSIS — G8929 Other chronic pain: Secondary | ICD-10-CM

## 2019-12-19 DIAGNOSIS — R1032 Left lower quadrant pain: Secondary | ICD-10-CM

## 2020-01-05 NOTE — Telephone Encounter (Signed)
[place], can you take a look? 

## 2020-01-29 ENCOUNTER — Ambulatory Visit (INDEPENDENT_AMBULATORY_CARE_PROVIDER_SITE_OTHER): Payer: No Typology Code available for payment source | Admitting: Gastroenterology

## 2020-01-29 ENCOUNTER — Other Ambulatory Visit: Payer: Self-pay

## 2020-01-29 ENCOUNTER — Encounter: Payer: Self-pay | Admitting: Gastroenterology

## 2020-01-29 VITALS — BP 130/74 | HR 76 | Temp 97.5°F | Ht 68.0 in | Wt 192.5 lb

## 2020-01-29 DIAGNOSIS — R1012 Left upper quadrant pain: Secondary | ICD-10-CM

## 2020-01-29 DIAGNOSIS — Z7289 Other problems related to lifestyle: Secondary | ICD-10-CM

## 2020-01-29 DIAGNOSIS — G8929 Other chronic pain: Secondary | ICD-10-CM | POA: Diagnosis not present

## 2020-01-29 DIAGNOSIS — Z789 Other specified health status: Secondary | ICD-10-CM

## 2020-01-29 MED ORDER — OMEPRAZOLE 40 MG PO CPDR: 40.0000 mg | DELAYED_RELEASE_CAPSULE | Freq: Every day | ORAL | 0 refills | Status: DC

## 2020-01-29 NOTE — Progress Notes (Signed)
Cephas Darby, MD [address]  [place]  [place], [place] [address]  Main: [phone]  Fax: [phone]    Gastroenterology Consultation  Referring Provider:     [name], NP Primary Care Physician:  [name], NP Primary Gastroenterologist:  Dr. Cephas Darby Reason for Consultation: Chronic left upper quadrant pain        HPI:   Julian Randolph is a 44 y.o. male referred by Dr. Carlis Abbott, Leticia Penna, NP  for consultation & management of chronic left upper quadrant pain.  Patient reports that he has been experiencing left-sided abdominal pain that has been ongoing for more than a year. He underwent CT abdomen in 2019, which was unremarkable except for small hemangioma of the liver.  His pancreas is normal.  He acknowledges drinking 4 to 5 packs of beer about 3 times a week.  Since onset of pain, he has cut back to 1-2 times a week and he noticed some improvement.  Today, he wants to know if he has ulcer in the stomach causing the pain.  He also acknowledges consuming Goody powder, aspirin related products for the pain which he has stopped about 2 months ago.  His pain gets worse the day after he consumes alcohol.  He denies black stools, nausea, vomiting, hematemesis.  His most recent LFTs are unremarkable.  H. pylori stool antigen was negative He denies any weight loss, loss of appetite  NSAIDs: Goody powder use, aspirin products about 4-5 times a week  Antiplts/Anticoagulants/Anti thrombotics: None  GI Procedures: None He denies family history of GI malignancy  Past Medical History:  Diagnosis Date  . Allergy   . Chickenpox     History reviewed. No pertinent surgical history.   Current Outpatient Medications:  .  omeprazole (PRILOSEC) 40 MG capsule, Take 1 capsule (40 mg total) by mouth daily before breakfast., Disp: 90 capsule, Rfl: 0  Family History  Problem Relation Age of Onset  . Healthy Mother   . Healthy Father   . Colon cancer  Maternal Uncle 77     Social History   Tobacco Use  . Smoking status: Former Smoker    Packs/day: 0.50    Types: Cigarettes  . Smokeless tobacco: Former Systems developer    Types: Chew  Substance Use Topics  . Alcohol use: Yes    Alcohol/week: 3.0 standard drinks    Types: 3 Standard drinks or equivalent per week    Comment: socially  . Drug use: No    Allergies as of 01/29/2020  . (No Known Allergies)    Review of Systems:    All systems reviewed and negative except where noted in HPI.   Physical Exam:  BP 130/74 (BP Location: Left Arm, Patient Position: Sitting, Cuff Size: Normal)   Pulse 76   Temp (!) 97.5 F (36.4 C) (Oral)   Ht 5\' 8"  (1.727 m)   Wt 192 lb 8 oz (87.3 kg)   BMI 29.27 kg/m  No LMP for male patient.  General:   Alert,  Well-developed, well-nourished, pleasant and cooperative in NAD Head:  Normocephalic and atraumatic. Eyes:  Sclera clear, no icterus.   Conjunctiva pink. Ears:  Normal auditory acuity. Nose:  No deformity, discharge, or lesions. Mouth:  No deformity or lesions,oropharynx pink & moist. Neck:  Supple; no masses or thyromegaly. Lungs:  Respirations even and unlabored.  Clear throughout to auscultation.   No wheezes, crackles, or rhonchi. No acute distress. Heart:  Regular rate and  rhythm; no murmurs, clicks, rubs, or gallops. Abdomen:  Normal bowel sounds. Soft, non-tender and non-distended without masses, hepatosplenomegaly or hernias noted.  No guarding or rebound tenderness.   Rectal: Not performed Msk:  Symmetrical without gross deformities. Good, equal movement & strength bilaterally. Pulses:  Normal pulses noted. Extremities:  No clubbing or edema.  No cyanosis. Neurologic:  Alert and oriented x3;  grossly normal neurologically. Skin:  Intact without significant lesions or rashes. No jaundice. Psych:  Alert and cooperative. Normal mood and affect.  Imaging Studies: Reviewed  Assessment and Plan:   Julian Randolph is a 44 y.o. male  with no significant past medical history, history of chronic alcohol use is seen in consultation for chronic left-sided abdominal pain, predominantly in the upper quadrant.  Differentials include intermittent attacks of mild pancreatitis triggered with alcohol intake or peptic ulcer disease or alcoholic gastritis.  H. pylori stool antigen is negative.  Colonic malignancy has to be ruled out as well  Recommendations Complete abstinence from alcohol Completely stop NSAID use Start omeprazole 40 mg daily before breakfast at least for 74month Recommend EGD for further evaluation Also, discussed about screening colonoscopy given his age.  Patient would like to check with his insurance regarding the co-pay for procedures contact my office back   Follow up as needed   Cephas Darby, MD

## 2020-02-02 ENCOUNTER — Telehealth: Payer: Self-pay

## 2020-02-02 ENCOUNTER — Other Ambulatory Visit: Payer: Self-pay

## 2020-02-02 DIAGNOSIS — Z1211 Encounter for screening for malignant neoplasm of colon: Secondary | ICD-10-CM

## 2020-02-02 DIAGNOSIS — G8929 Other chronic pain: Secondary | ICD-10-CM

## 2020-02-02 DIAGNOSIS — R1012 Left upper quadrant pain: Secondary | ICD-10-CM

## 2020-02-02 MED ORDER — NA SULFATE-K SULFATE-MG SULF 17.5-3.13-1.6 GM/177ML PO SOLN: 354.0000 mL | Freq: Once | ORAL | 0 refills | Status: AC

## 2020-02-02 NOTE — Telephone Encounter (Signed)
Patient wife called to find out if we do prior authorizations for the EGD and Colonoscopy. Informed her that we did and then she could call her insurance plan to find out a price. She scheduled patient colonoscopy and EGD for 03/05/2020. Went over instructions with patient wife and sent to [employer] and mailed. Sent prep to pharmacy

## 2020-02-23 ENCOUNTER — Telehealth: Payer: Self-pay | Admitting: Gastroenterology

## 2020-02-23 NOTE — Telephone Encounter (Signed)
Patient called & l/m on v/m asking questions about his co-pay & what time the procedure would be . I have called & l/m @ [phone] for him to return my call.

## 2020-02-24 ENCOUNTER — Telehealth: Payer: Self-pay

## 2020-02-24 NOTE — Telephone Encounter (Signed)
Patient returned call back in regards to rescheduling his colonoscopy due to change in Dr. Verlin Grills scheduled on 03/05/20.  Patient has agreed to reschedule to 03/08/20.  Patient has been advised of COVID test date Thursday 03/04/20 at [place] [facility] between 8am-1pm.  Patient inquired why he needs to have COVID Vaccine-he has been advised this is mandatory to keep his procedure as schedule.  He also inquired about his insurance billing.  He has been asked to contact his insurance provider to discuss how his insurance will cover.  Thanks,  [place], [place]

## 2020-02-24 NOTE — Telephone Encounter (Signed)
LVM for pt to call the office back in regards to rescheduling his 03/05/20 colon w/egd scheduled with Dr. Marius Ditch.  Needs to be rescheduled due to scope schedule change at [facility].  Thanks,  Sharyn Lull, CMA    Schedule Note:Colonoscopy and EGD on 03/05/2020 with Dr. Marius Ditch at [facility]  DX Colonoscopy Screening Z12.11 EGD left upper abdominal pain R10.12 CPT [id number]

## 2020-03-04 ENCOUNTER — Other Ambulatory Visit: Payer: Self-pay

## 2020-03-04 ENCOUNTER — Other Ambulatory Visit
Admission: RE | Admit: 2020-03-04 | Discharge: 2020-03-04 | Disposition: A | Discharge status: Home / Self Care | Payer: No Typology Code available for payment source | Source: Ambulatory Visit | Attending: Gastroenterology | Admitting: Gastroenterology

## 2020-03-04 DIAGNOSIS — Z01812 Encounter for preprocedural laboratory examination: Secondary | ICD-10-CM | POA: Diagnosis not present

## 2020-03-04 DIAGNOSIS — Z20822 Contact with and (suspected) exposure to covid-19: Secondary | ICD-10-CM | POA: Insufficient documentation

## 2020-03-04 LAB — SARS CORONAVIRUS 2 (TAT 6-24 HRS): SARS Coronavirus 2: NEGATIVE

## 2020-03-05 ENCOUNTER — Encounter: Payer: Self-pay | Admitting: Gastroenterology

## 2020-03-08 ENCOUNTER — Other Ambulatory Visit: Payer: Self-pay

## 2020-03-08 ENCOUNTER — Encounter: Payer: Self-pay | Admitting: Gastroenterology

## 2020-03-08 ENCOUNTER — Ambulatory Visit: Payer: No Typology Code available for payment source | Admitting: Anesthesiology

## 2020-03-08 ENCOUNTER — Encounter
Admission: RE | Disposition: A | Discharge status: Home / Self Care | Payer: Self-pay | Source: Home / Self Care | Attending: Gastroenterology

## 2020-03-08 ENCOUNTER — Ambulatory Visit
Admission: RE | Admit: 2020-03-08 | Discharge: 2020-03-08 | Disposition: A | Discharge status: Home / Self Care | Payer: No Typology Code available for payment source | Attending: Gastroenterology | Admitting: Gastroenterology

## 2020-03-08 DIAGNOSIS — Z79899 Other long term (current) drug therapy: Secondary | ICD-10-CM | POA: Insufficient documentation

## 2020-03-08 DIAGNOSIS — Z800 Family history of malignant neoplasm of digestive organs: Secondary | ICD-10-CM | POA: Insufficient documentation

## 2020-03-08 DIAGNOSIS — Z1211 Encounter for screening for malignant neoplasm of colon: Secondary | ICD-10-CM | POA: Insufficient documentation

## 2020-03-08 DIAGNOSIS — R1012 Left upper quadrant pain: Secondary | ICD-10-CM

## 2020-03-08 DIAGNOSIS — G8929 Other chronic pain: Secondary | ICD-10-CM | POA: Insufficient documentation

## 2020-03-08 DIAGNOSIS — R109 Unspecified abdominal pain: Secondary | ICD-10-CM | POA: Diagnosis not present

## 2020-03-08 DIAGNOSIS — D124 Benign neoplasm of descending colon: Secondary | ICD-10-CM | POA: Diagnosis not present

## 2020-03-08 DIAGNOSIS — Z87891 Personal history of nicotine dependence: Secondary | ICD-10-CM | POA: Insufficient documentation

## 2020-03-08 DIAGNOSIS — K5730 Diverticulosis of large intestine without perforation or abscess without bleeding: Secondary | ICD-10-CM | POA: Insufficient documentation

## 2020-03-08 HISTORY — PX: ESOPHAGOGASTRODUODENOSCOPY (EGD) WITH PROPOFOL: SHX5813

## 2020-03-08 HISTORY — PX: COLONOSCOPY WITH PROPOFOL: SHX5780

## 2020-03-08 SURGERY — COLONOSCOPY WITH PROPOFOL
Anesthesia: General

## 2020-03-08 MED ORDER — SODIUM CHLORIDE 0.9 % IV SOLN
INTRAVENOUS | Status: DC
  Administered 2020-03-08: 10:00:00 1000 mL via INTRAVENOUS

## 2020-03-08 MED ORDER — EPHEDRINE 5 MG/ML INJ
INTRAVENOUS | Status: AC
  Filled 2020-03-08: qty 4

## 2020-03-08 MED ORDER — EPHEDRINE SULFATE 50 MG/ML IJ SOLN
INTRAMUSCULAR | Status: DC | PRN
  Administered 2020-03-08: 5 mg via INTRAVENOUS
  Administered 2020-03-08: 10 mg via INTRAVENOUS
  Administered 2020-03-08 (×2): 5 mg via INTRAVENOUS

## 2020-03-08 MED ORDER — PROPOFOL 500 MG/50ML IV EMUL
INTRAVENOUS | Status: AC
  Filled 2020-03-08: qty 50

## 2020-03-08 MED ORDER — LIDOCAINE HCL (PF) 2 % IJ SOLN
INTRAMUSCULAR | Status: AC
  Filled 2020-03-08: qty 5

## 2020-03-08 MED ORDER — LIDOCAINE HCL (CARDIAC) PF 100 MG/5ML IV SOSY
PREFILLED_SYRINGE | INTRAVENOUS | Status: DC | PRN
  Administered 2020-03-08: 50 mg via INTRAVENOUS

## 2020-03-08 MED ORDER — MIDAZOLAM HCL 2 MG/2ML IJ SOLN
INTRAMUSCULAR | Status: AC
  Filled 2020-03-08: qty 2

## 2020-03-08 MED ORDER — MIDAZOLAM HCL 2 MG/2ML IJ SOLN
INTRAMUSCULAR | Status: DC | PRN
  Administered 2020-03-08: 2 mg via INTRAVENOUS

## 2020-03-08 MED ORDER — BUTAMBEN-TETRACAINE-BENZOCAINE 2-2-14 % EX AERO
INHALATION_SPRAY | CUTANEOUS | Status: DC | PRN
  Administered 2020-03-08: 10:00:00 2 via TOPICAL

## 2020-03-08 MED ORDER — FENTANYL CITRATE (PF) 100 MCG/2ML IJ SOLN
INTRAMUSCULAR | Status: DC | PRN
  Administered 2020-03-08: 50 ug via INTRAVENOUS

## 2020-03-08 MED ORDER — FENTANYL CITRATE (PF) 100 MCG/2ML IJ SOLN
INTRAMUSCULAR | Status: AC
  Filled 2020-03-08: qty 2

## 2020-03-08 MED ORDER — PROPOFOL 500 MG/50ML IV EMUL
INTRAVENOUS | Status: DC | PRN
  Administered 2020-03-08: 125 ug/kg/min via INTRAVENOUS

## 2020-03-08 NOTE — Anesthesia Postprocedure Evaluation (Signed)
Anesthesia Post Note  Patient: Julian Randolph  Procedure(s) Performed: COLONOSCOPY WITH PROPOFOL (N/A ) ESOPHAGOGASTRODUODENOSCOPY (EGD) WITH PROPOFOL (N/A )  Patient location during evaluation: Endoscopy Anesthesia Type: General Level of consciousness: awake and alert and oriented Pain management: pain level controlled Vital Signs Assessment: post-procedure vital signs reviewed and stable Respiratory status: spontaneous breathing Cardiovascular status: blood pressure returned to baseline Anesthetic complications: no     Last Vitals:  Vitals:   03/08/20 1034 03/08/20 1044  BP: 108/66 107/73  Pulse: 98 89  Resp: 14 16  Temp:    SpO2: 100% 99%    Last Pain:  Vitals:   03/08/20 1044  TempSrc:   PainSc: 0-No pain                 Keyri Salberg

## 2020-03-08 NOTE — H&P (Signed)
Cephas Darby, MD [address]  [place], [place] [address]  Main: [phone]  Fax: [phone] Pager: [phone]  Primary Care Physician:  [name], NP Primary Gastroenterologist:  Dr. Cephas Darby  Pre-Procedure History & Physical: HPI:  Julian Randolph is a 44 y.o. male is here for an endoscopy and colonoscopy.   Past Medical History:  Diagnosis Date  . Allergy   . Chickenpox     History reviewed. No pertinent surgical history.  Prior to Admission medications   Medication Sig Start Date End Date Taking? Authorizing Provider  omeprazole (PRILOSEC) 40 MG capsule Take 1 capsule (40 mg total) by mouth daily before breakfast. 01/29/20   Lin Landsman, MD    Allergies as of 02/02/2020  . (No Known Allergies)    Family History  Problem Relation Age of Onset  . Healthy Mother   . Healthy Father   . Colon cancer Maternal Uncle 93    Social History   Socioeconomic History  . Marital status: Married    Spouse name: Not on file  . Number of children: Not on file  . Years of education: Not on file  . Highest education level: Not on file  Occupational History  . Not on file  Tobacco Use  . Smoking status: Former Smoker    Packs/day: 0.50    Types: Pipe  . Smokeless tobacco: Former Systems developer    Types: Chew  Substance and Sexual Activity  . Alcohol use: Yes    Alcohol/week: 3.0 standard drinks    Types: 3 Standard drinks or equivalent per week    Comment: socially  . Drug use: No  . Sexual activity: Not on file  Other Topics Concern  . Not on file  Social History Narrative   Married.   2 children.   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   Enjoys spending time with family, golfing.   Social Determinants of Health   Financial Resource Strain:   . Difficulty of Paying Living Expenses:   Food Insecurity:   . Worried About Charity fundraiser in the Last Year:   . Arboriculturist in the Last Year:     Transportation Needs:   . Film/video editor (Medical):   Marland Kitchen Lack of Transportation (Non-Medical):   Physical Activity:   . Days of Exercise per Week:   . Minutes of Exercise per Session:   Stress:   . Feeling of Stress :   Social Connections:   . Frequency of Communication with Friends and Family:   . Frequency of Social Gatherings with Friends and Family:   . Attends Religious Services:   . Active Member of Clubs or Organizations:   . Attends Archivist Meetings:   Marland Kitchen Marital Status:   Intimate Partner Violence:   . Fear of Current or Ex-Partner:   . Emotionally Abused:   Marland Kitchen Physically Abused:   . Sexually Abused:     Review of Systems: See HPI, otherwise negative ROS  Physical Exam: BP 116/86   Pulse 96   Temp (!) 97.1 F (36.2 C) (Tympanic)   Resp 18   Ht 5\' 9"  (1.753 m)   Wt 81.6 kg   SpO2 99%   BMI 26.58 kg/m  General:   Alert,  pleasant and cooperative in NAD Head:  Normocephalic and atraumatic. Neck:  Supple; no masses or thyromegaly. Lungs:  Clear throughout to auscultation.    Heart:  Regular  rate and rhythm. Abdomen:  Soft, nontender and nondistended. Normal bowel sounds, without guarding, and without rebound.   Neurologic:  Alert and  oriented x4;  grossly normal neurologically.  Impression/Plan: Julian Randolph is here for an endoscopy and colonoscopy to be performed for chronic LUQ pain, colon cancer screening  Risks, benefits, limitations, and alternatives regarding  endoscopy and colonoscopy have been reviewed with the patient.  Questions have been answered.  All parties agreeable.   Sherri Sear, MD  03/08/2020, 9:55 AM

## 2020-03-08 NOTE — Op Note (Signed)
[facility] Gastroenterology Patient Name: Julian Randolph Procedure Date: 03/08/2020 9:47 AM MRN: [id number] Account #: [id number] Date of Birth: [birth year] Admit Type: Outpatient Age: 44 Room: [facility] ENDO ROOM 1 Gender: Male Note Status: Finalized Procedure:             Colonoscopy Indications:           Screening for colorectal malignant neoplasm, This is                         the patient's first colonoscopy Providers:             Lin Landsman MD, MD Medicines:             Monitored Anesthesia Care Complications:         No immediate complications. Estimated blood loss: None. Procedure:             Pre-Anesthesia Assessment:                        - Prior to the procedure, a History and Physical was                         performed, and patient medications and allergies were                         reviewed. The patient is competent. The risks and                         benefits of the procedure and the sedation options and                         risks were discussed with the patient. All questions                         were answered and informed consent was obtained.                         Patient identification and proposed procedure were                         verified by the physician, the nurse, the                         anesthesiologist, the anesthetist and the technician                         in the pre-procedure area in the procedure room in the                         endoscopy suite. Mental Status Examination: alert and                         oriented. Airway Examination: normal oropharyngeal                         airway and neck mobility. Respiratory Examination:                         clear to auscultation. CV Examination: normal.  Prophylactic Antibiotics: The patient does not require                         prophylactic antibiotics. Prior Anticoagulants: The                         patient has taken no  previous anticoagulant or                         antiplatelet agents. ASA Grade Assessment: II - A                         patient with mild systemic disease. After reviewing                         the risks and benefits, the patient was deemed in                         satisfactory condition to undergo the procedure. The                         anesthesia plan was to use monitored anesthesia care                         (MAC). Immediately prior to administration of                         medications, the patient was re-assessed for adequacy                         to receive sedatives. The heart rate, respiratory                         rate, oxygen saturations, blood pressure, adequacy of                         pulmonary ventilation, and response to care were                         monitored throughout the procedure. The physical                         status of the patient was re-assessed after the                         procedure.                        After obtaining informed consent, the colonoscope was                         passed under direct vision. Throughout the procedure,                         the patient's blood pressure, pulse, and oxygen                         saturations were monitored continuously. The  Colonoscope was introduced through the anus and                         advanced to the the terminal ileum, with                         identification of the appendiceal orifice and IC                         valve. The colonoscopy was performed without                         difficulty. The patient tolerated the procedure well.                         The quality of the bowel preparation was evaluated                         using the BBPS [facility] Bowel Preparation Scale) with                         scores of: Right Colon = 3, Transverse Colon = 3 and                         Left Colon = 3 (entire mucosa seen well with no                          residual staining, small fragments of stool or opaque                         liquid). The total BBPS score equals 9. Findings:      The perianal and digital rectal examinations were normal. Pertinent       negatives include normal sphincter tone and no palpable rectal lesions.      Multiple diverticula were found in the entire colon.      A 6 mm polyp was found in the descending colon. The polyp was sessile.       The polyp was removed with a cold snare. Resection and retrieval were       complete.      The retroflexed view of the distal rectum and anal verge was normal and       showed no anal or rectal abnormalities.      The exam was otherwise without abnormality. Impression:            - Diverticulosis in the entire examined colon.                        - One 6 mm polyp in the descending colon, removed with                         a cold snare. Resected and retrieved.                        - The distal rectum and anal verge are normal on                         retroflexion view.                        -  The examination was otherwise normal. Recommendation:        - Discharge patient to home (with escort).                        - Resume previous diet today.                        - Continue present medications.                        - Await pathology results.                        - Repeat colonoscopy in 7 years for surveillance. Procedure Code(s):     --- Professional ---                        [phone], Colonoscopy, flexible; with removal of                         tumor(s), polyp(s), or other lesion(s) by snare                         technique Diagnosis Code(s):     --- Professional ---                        Z12.11, Encounter for screening for malignant neoplasm                         of colon                        K63.5, Polyp of colon                        K57.30, Diverticulosis of large intestine without                         perforation or abscess  without bleeding CPT copyright 2019 American Medical Association. All rights reserved. The codes documented in this report are preliminary and upon coder review may  be revised to meet current compliance requirements. Dr. Ulyess Mort Lin Landsman MD, MD 03/08/2020 10:22:27 AM This report has been signed electronically. Number of Addenda: 0 Note Initiated On: 03/08/2020 9:47 AM Scope Withdrawal Time: 0 hours 10 minutes 6 seconds  Total Procedure Duration: 0 hours 14 minutes 7 seconds  Estimated Blood Loss:  Estimated blood loss: none.      [facility]

## 2020-03-08 NOTE — Transfer of Care (Signed)
Immediate Anesthesia Transfer of Care Note  Patient: Julian Randolph  Procedure(s) Performed: COLONOSCOPY WITH PROPOFOL (N/A ) ESOPHAGOGASTRODUODENOSCOPY (EGD) WITH PROPOFOL (N/A )  Patient Location: PACU  Anesthesia Type:General  Level of Consciousness: awake and sedated  Airway & Oxygen Therapy: Patient Spontanous Breathing and Patient connected to nasal cannula oxygen  Post-op Assessment: Report given to RN and Post -op Vital signs reviewed and stable  Post vital signs: Reviewed and stable  Last Vitals:  Vitals Value Taken Time  BP    Temp    Pulse    Resp    SpO2      Last Pain:  Vitals:   03/08/20 0919  TempSrc: Tympanic  PainSc: 0-No pain         Complications: No apparent anesthesia complications

## 2020-03-08 NOTE — Op Note (Signed)
[facility] Gastroenterology Patient Name: Julian Randolph Procedure Date: 03/08/2020 9:47 AM MRN: [id number] Account #: [id number] Date of Birth: 1976-05-08 Admit Type: Outpatient Age: 44 Room: [facility] ENDO ROOM 1 Gender: Male Note Status: Finalized Procedure:             Upper GI endoscopy Indications:           Abdominal pain in the left upper quadrant, , h/o NSAID                         use Providers:             Lin Landsman MD, MD Medicines:             Monitored Anesthesia Care Complications:         No immediate complications. Estimated blood loss: None. Procedure:             Pre-Anesthesia Assessment:                        - Prior to the procedure, a History and Physical was                         performed, and patient medications and allergies were                         reviewed. The patient is competent. The risks and                         benefits of the procedure and the sedation options and                         risks were discussed with the patient. All questions                         were answered and informed consent was obtained.                         Patient identification and proposed procedure were                         verified by the physician, the nurse, the                         anesthesiologist, the anesthetist and the technician                         in the pre-procedure area in the procedure room in the                         endoscopy suite. Mental Status Examination: alert and                         oriented. Airway Examination: normal oropharyngeal                         airway and neck mobility. Respiratory Examination:                         clear to auscultation. CV  Examination: normal.                         Prophylactic Antibiotics: The patient does not require                         prophylactic antibiotics. Prior Anticoagulants: The                         patient has taken no previous  anticoagulant or                         antiplatelet agents. ASA Grade Assessment: II - A                         patient with mild systemic disease. After reviewing                         the risks and benefits, the patient was deemed in                         satisfactory condition to undergo the procedure. The                         anesthesia plan was to use monitored anesthesia care                         (MAC). Immediately prior to administration of                         medications, the patient was re-assessed for adequacy                         to receive sedatives. The heart rate, respiratory                         rate, oxygen saturations, blood pressure, adequacy of                         pulmonary ventilation, and response to care were                         monitored throughout the procedure. The physical                         status of the patient was re-assessed after the                         procedure.                        After obtaining informed consent, the endoscope was                         passed under direct vision. Throughout the procedure,                         the patient's blood pressure, pulse, and oxygen  saturations were monitored continuously. The Endoscope                         was introduced through the mouth, and advanced to the                         second part of duodenum. The upper GI endoscopy was                         accomplished without difficulty. The patient tolerated                         the procedure well. Findings:      The esophagus was normal.      The stomach was normal. Biopsies were taken with a cold forceps for       Helicobacter pylori testing.      The examined duodenum was normal.      A medium non-bleeding diverticulum was found in the second portion of       the duodenum.      Esophagogastric landmarks were identified: the gastroesophageal junction       was found at 40 cm from  the incisors. Impression:            - Normal esophagus.                        - Normal stomach.                        - Normal examined duodenum.                        - No specimens collected. Recommendation:        - Await pathology results.                        - No ibuprofen, naproxen, or other non-steroidal                         anti-inflammatory drugs.                        - Proceed with colonoscopy as scheduled                        See colonoscopy report Procedure Code(s):     --- Professional ---                        [phone], Esophagogastroduodenoscopy, flexible,                         transoral; with biopsy, single or multiple Diagnosis Code(s):     --- Professional ---                        R10.12, Left upper quadrant pain CPT copyright 2019 American Medical Association. All rights reserved. The codes documented in this report are preliminary and upon coder review may  be revised to meet current compliance requirements. Dr. Ulyess Mort Lin Landsman MD, MD 03/08/2020 10:04:59 AM This report has been signed electronically. Number of Addenda: 0 Note Initiated On:  03/08/2020 9:47 AM Estimated Blood Loss:  Estimated blood loss: none.      [facility]

## 2020-03-08 NOTE — Anesthesia Preprocedure Evaluation (Signed)
Anesthesia Evaluation  Patient identified by MRN, date of birth, ID band Patient awake    Reviewed: Allergy & Precautions, NPO status , Patient's Chart, lab work & pertinent test results  Airway Mallampati: II       Dental   Pulmonary former smoker,           Cardiovascular      Neuro/Psych negative neurological ROS  negative psych ROS   GI/Hepatic Neg liver ROS, Chronic abdominal pain   Endo/Other  negative endocrine ROS  Renal/GU negative Renal ROS  negative genitourinary   Musculoskeletal negative musculoskeletal ROS (+)   Abdominal   Peds negative pediatric ROS (+)  Hematology negative hematology ROS (+)   Anesthesia Other Findings Past Medical History: No date: Allergy No date: Chickenpox  Reproductive/Obstetrics                             Anesthesia Physical Anesthesia Plan  ASA: II  Anesthesia Plan: General   Post-op Pain Management:    Induction: Intravenous  PONV Risk Score and Plan:   Airway Management Planned: Nasal Cannula  Additional Equipment:   Intra-op Plan:   Post-operative Plan:   Informed Consent: I have reviewed the patients History and Physical, chart, labs and discussed the procedure including the risks, benefits and alternatives for the proposed anesthesia with the patient or authorized representative who has indicated his/her understanding and acceptance.     Dental advisory given  Plan Discussed with: CRNA and Surgeon  Anesthesia Plan Comments:         Anesthesia Quick Evaluation

## 2020-03-09 LAB — SURGICAL PATHOLOGY

## 2020-03-10 ENCOUNTER — Encounter: Payer: Self-pay | Admitting: *Deleted

## 2020-03-10 ENCOUNTER — Encounter: Payer: Self-pay | Admitting: Gastroenterology

## 2020-03-14 IMAGING — DX DG ABDOMEN 2V
3 series · 3 of 3 positions shown · non-contrast
Comparison: None.

CLINICAL DATA: Left lower quadrant abdominal pain over the last few
months.

EXAM:
ABDOMEN - 2 VIEW

[abdomen erect]
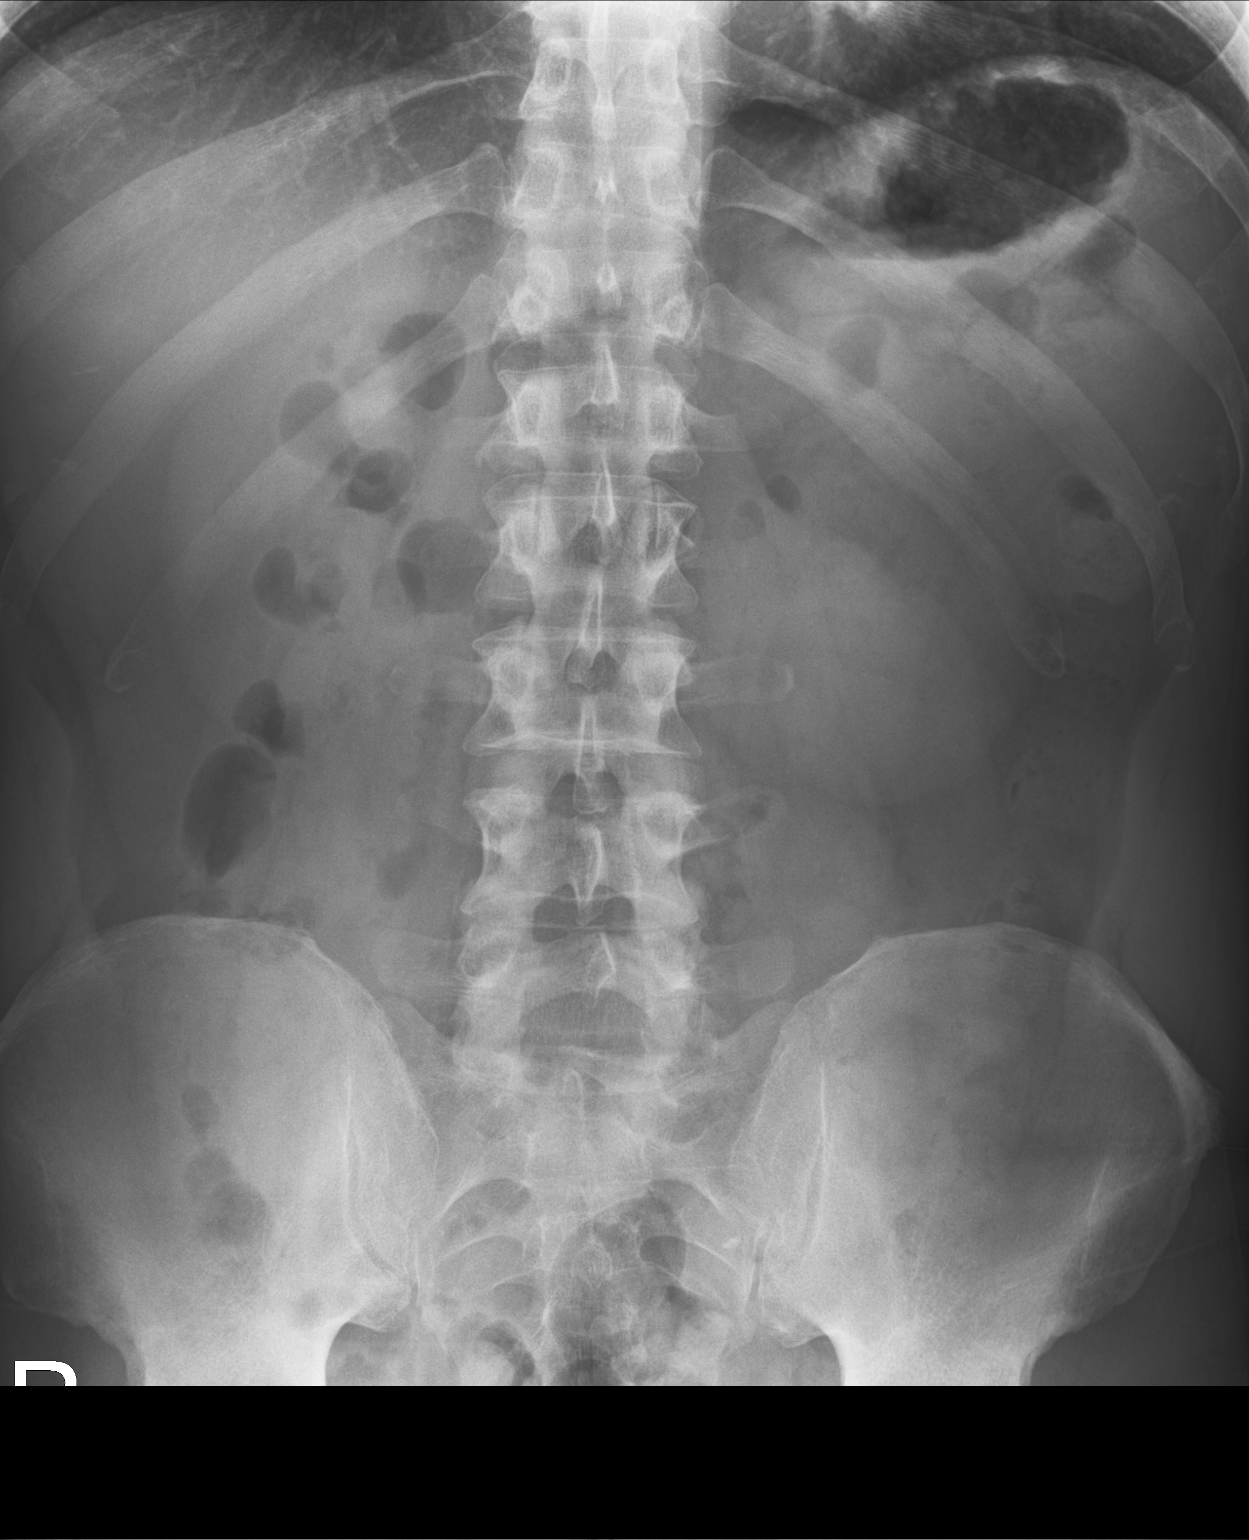

[abdomen supine (1 of 2)]
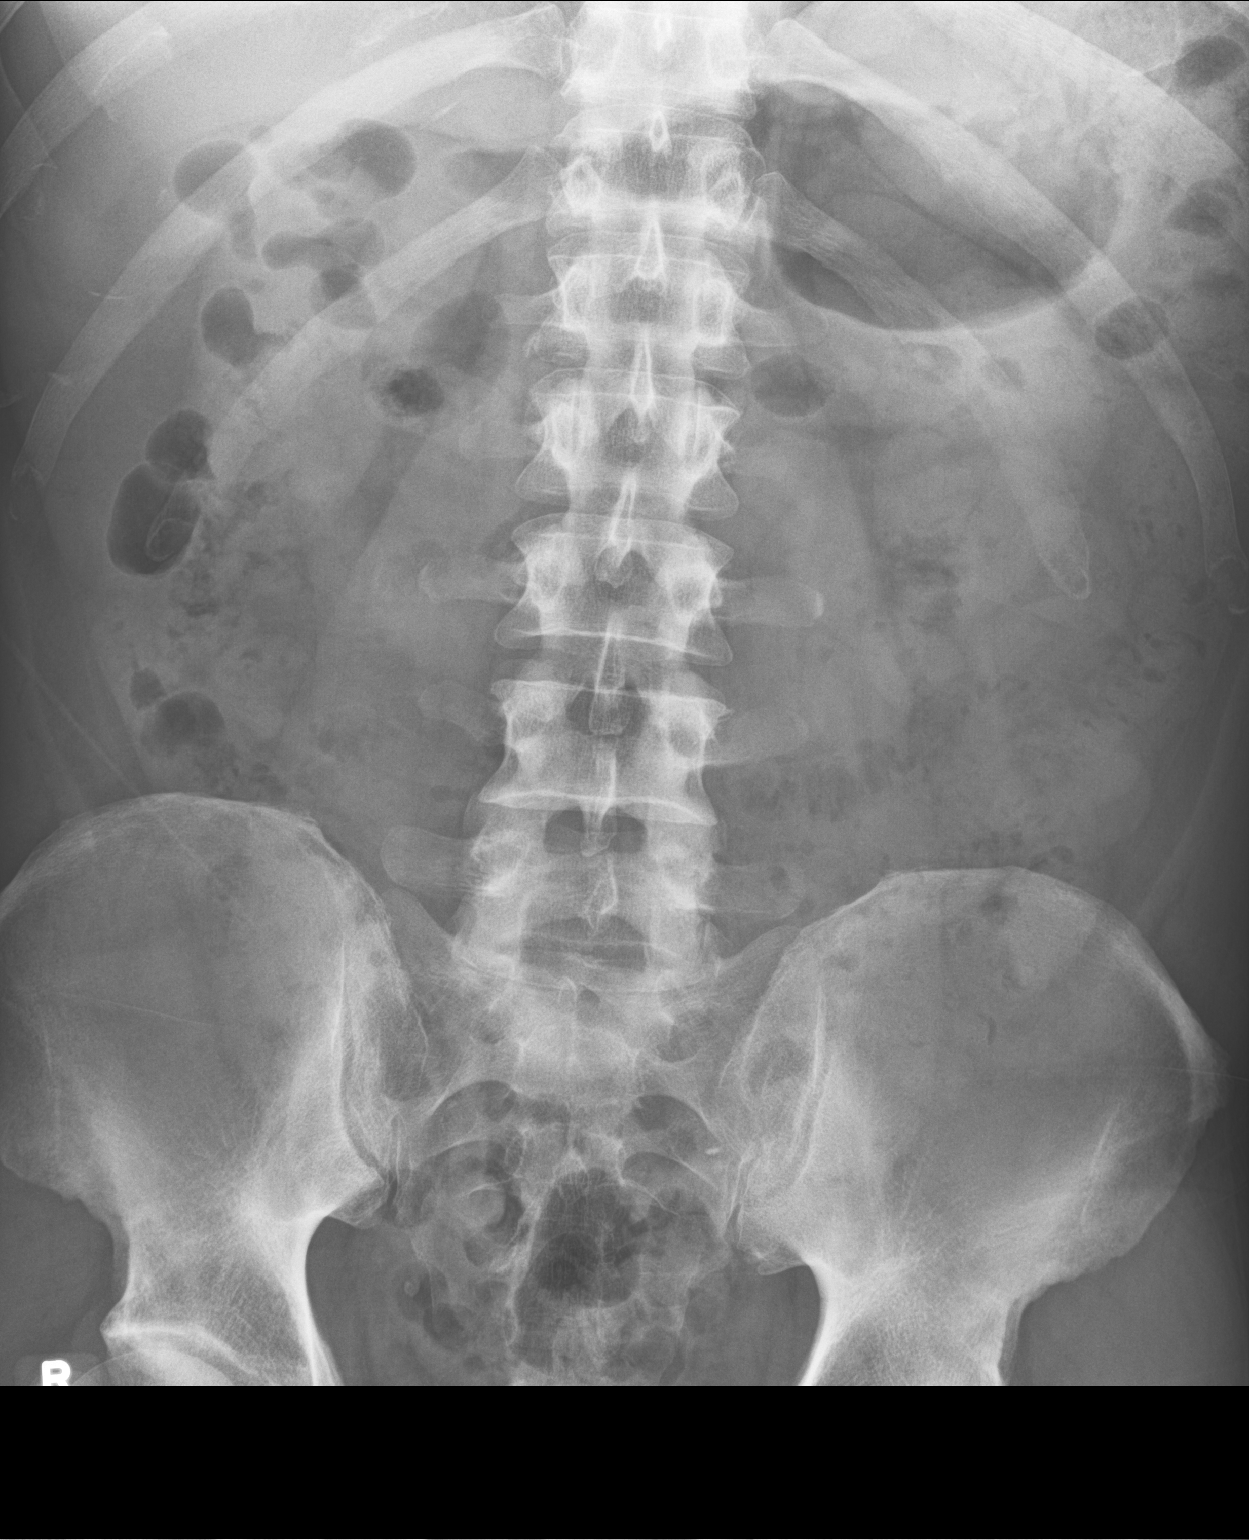

[abdomen supine (2 of 2)]
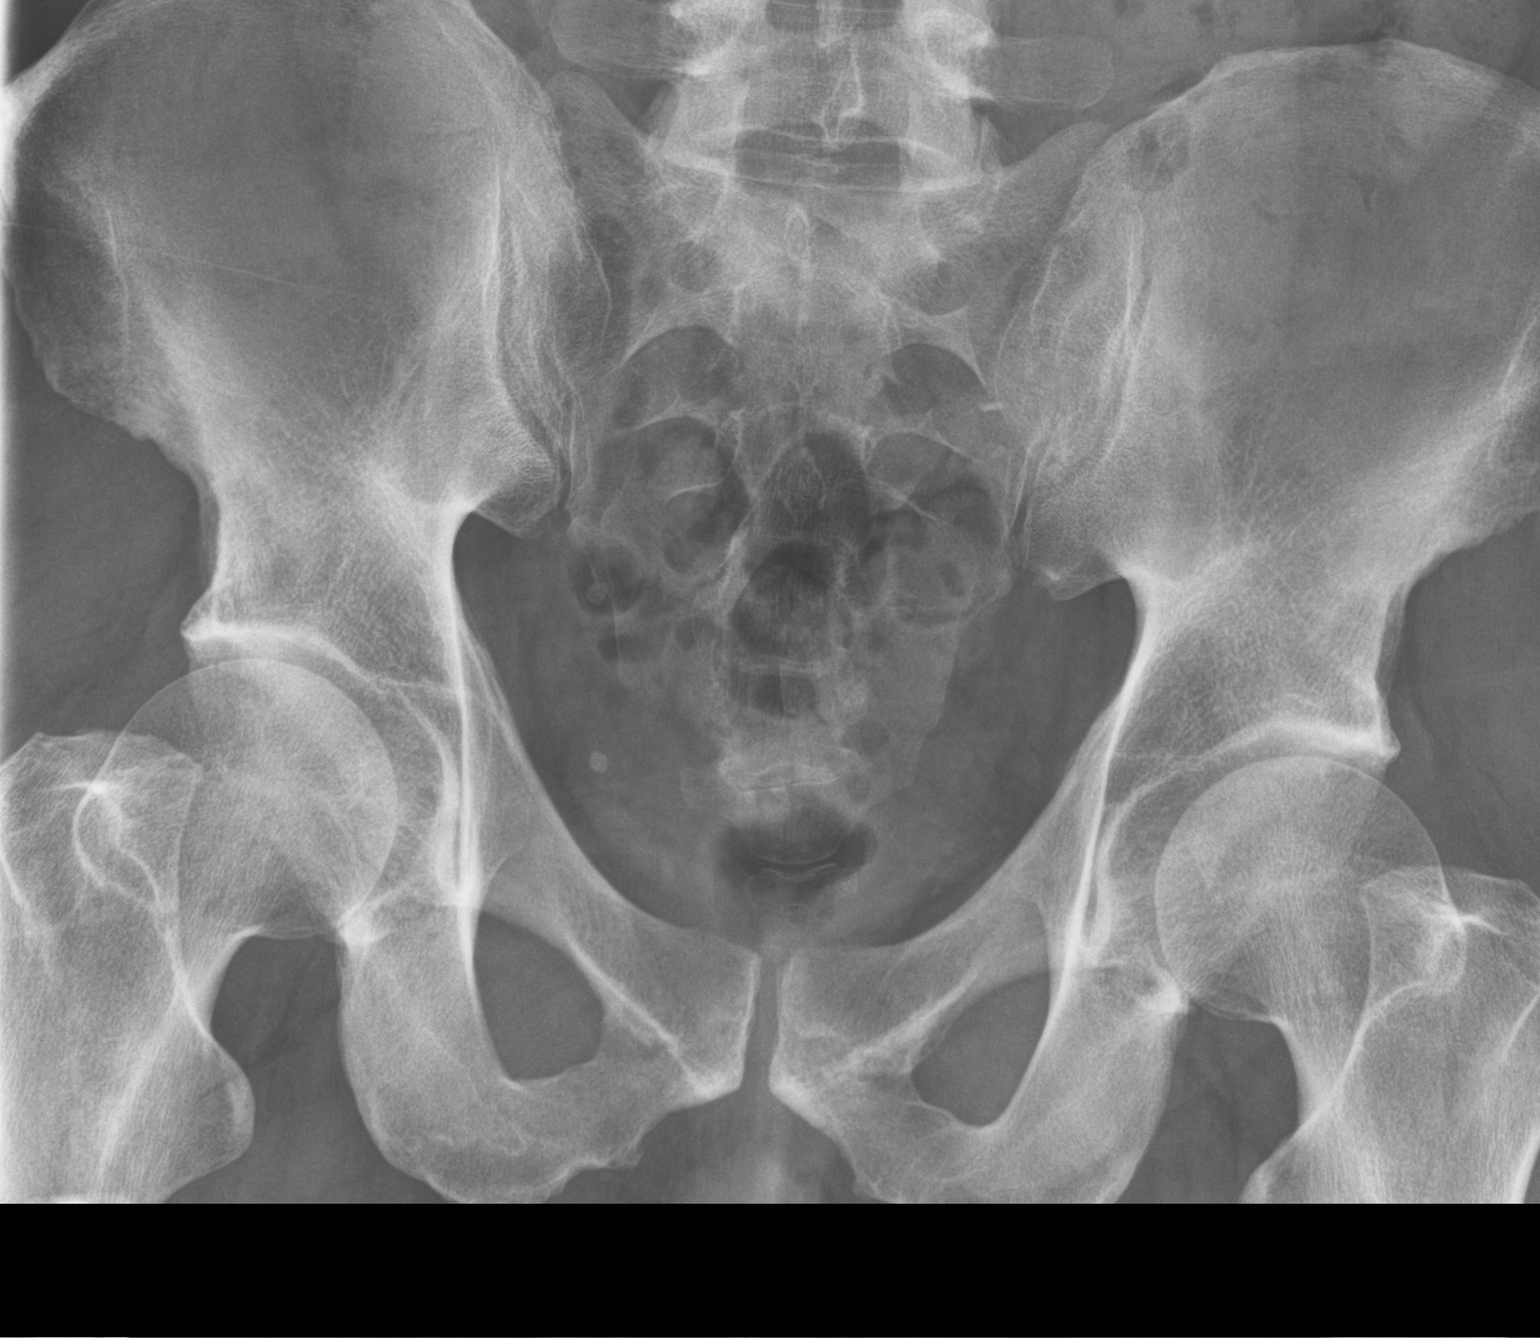

[3 of 3 positions shown; findings below may reference images not displayed]

FINDINGS: The bowel gas pattern is normal. There is no evidence of free air.
No radio-opaque calculi or other significant radiographic
abnormality is seen.
IMPRESSION: Negative.

## 2020-12-21 ENCOUNTER — Encounter: Payer: No Typology Code available for payment source | Admitting: Primary Care

## 2021-01-07 ENCOUNTER — Encounter: Payer: No Typology Code available for payment source | Admitting: Primary Care

## 2021-01-07 ENCOUNTER — Ambulatory Visit: Payer: No Typology Code available for payment source | Admitting: Primary Care

## 2021-01-07 ENCOUNTER — Telehealth: Payer: Self-pay

## 2021-01-07 DIAGNOSIS — Z0289 Encounter for other administrative examinations: Secondary | ICD-10-CM

## 2021-01-07 NOTE — Telephone Encounter (Signed)
[facility] Night - Client Nonclinical Telephone Record AccessNurse Client [facility] Night - Client Client Site [place] - Night Contact Type Call Who Is Calling Patient / Member / Family / Caregiver Caller Name [name] Caller Phone Number [phone] Patient Name Julian Randolph Patient DOB [birth year] Call Type Message Only Information Provided Reason for Call Request to Reschedule Office Appointment Initial Comment Caller has an appt with Loma Boston [phone] am and he needing to reschedule Disp. Time Disposition Final User 01/06/2021 5:51:19 PM General Information Provided Yes Kenton Kingfisher, Lanette Call Closed By: Nelia Shi Transaction Date/Time: 01/06/2021 5:49:03 PM (ET)

## 2021-01-10 NOTE — Telephone Encounter (Signed)
Called patient and schedule for CPE. 01/20/21

## 2021-01-20 ENCOUNTER — Ambulatory Visit: Payer: No Typology Code available for payment source | Admitting: Primary Care

## 2021-01-26 ENCOUNTER — Encounter: Payer: Self-pay | Admitting: Primary Care

## 2021-01-26 ENCOUNTER — Other Ambulatory Visit: Payer: Self-pay

## 2021-01-26 ENCOUNTER — Ambulatory Visit (INDEPENDENT_AMBULATORY_CARE_PROVIDER_SITE_OTHER): Payer: No Typology Code available for payment source | Admitting: Primary Care

## 2021-01-26 VITALS — BP 124/72 | HR 78 | Temp 97.6°F | Ht 69.0 in | Wt 184.0 lb

## 2021-01-26 DIAGNOSIS — Z0000 Encounter for general adult medical examination without abnormal findings: Secondary | ICD-10-CM | POA: Diagnosis not present

## 2021-01-26 DIAGNOSIS — E782 Mixed hyperlipidemia: Secondary | ICD-10-CM

## 2021-01-26 LAB — CBC WITH DIFFERENTIAL/PLATELET
Basophils Absolute: 0 10*3/uL (ref 0.0–0.1)
Basophils Relative: 0.4 % (ref 0.0–3.0)
Eosinophils Absolute: 0.1 10*3/uL (ref 0.0–0.7)
Eosinophils Relative: 1.5 % (ref 0.0–5.0)
HCT: 40.8 % (ref 39.0–52.0)
Hemoglobin: 14 g/dL (ref 13.0–17.0)
Lymphocytes Relative: 21.7 % (ref 12.0–46.0)
Lymphs Abs: 1.1 10*3/uL (ref 0.7–4.0)
MCHC: 34.2 g/dL (ref 30.0–36.0)
MCV: 87.8 fl (ref 78.0–100.0)
Monocytes Absolute: 0.4 10*3/uL (ref 0.1–1.0)
Monocytes Relative: 7.8 % (ref 3.0–12.0)
Neutro Abs: 3.6 10*3/uL (ref 1.4–7.7)
Neutrophils Relative %: 68.6 % (ref 43.0–77.0)
Platelets: 188 10*3/uL (ref 150.0–400.0)
RBC: 4.65 Mil/uL (ref 4.22–5.81)
RDW: 13.5 % (ref 11.5–15.5)
WBC: 5.2 10*3/uL (ref 4.0–10.5)

## 2021-01-26 LAB — COMPREHENSIVE METABOLIC PANEL
ALT: 14 U/L (ref 0–53)
AST: 18 U/L (ref 0–37)
Albumin: 4.3 g/dL (ref 3.5–5.2)
Alkaline Phosphatase: 37 U/L — ABNORMAL LOW (ref 39–117)
BUN: 10 mg/dL (ref 6–23)
CO2: 29 mEq/L (ref 19–32)
Calcium: 9.5 mg/dL (ref 8.4–10.5)
Chloride: 102 mEq/L (ref 96–112)
Creatinine, Ser: 1.06 mg/dL (ref 0.40–1.50)
GFR: 83.95 mL/min (ref >60.00)
Glucose, Bld: 89 mg/dL (ref 70–99)
Potassium: 4.2 mEq/L (ref 3.5–5.1)
Sodium: 137 mEq/L (ref 135–145)
Total Bilirubin: 1 mg/dL (ref 0.2–1.2)
Total Protein: 7.1 g/dL (ref 6.0–8.3)

## 2021-01-26 LAB — LIPID PANEL
Cholesterol: 191 mg/dL (ref 0–200)
HDL: 44.3 mg/dL (ref >39.00)
LDL Cholesterol: 119 mg/dL — ABNORMAL HIGH (ref 0–99)
NonHDL: 146.3
Total CHOL/HDL Ratio: 4
Triglycerides: 138 mg/dL (ref 0.0–149.0)
VLDL: 27.6 mg/dL (ref 0.0–40.0)

## 2021-01-26 NOTE — Assessment & Plan Note (Signed)
Commended him on a healthy lifestyle, repeat the panel pending.

## 2021-01-26 NOTE — Patient Instructions (Signed)
Stop by the lab prior to leaving today. I will notify you of your results once received.   Continue to work on a healthy diet. Ensure you are consuming 64 ounces of water daily.  Continue regular exercise.  It was a pleasure to see you today!   Preventive Care 47-47 Years Old, Male Preventive care refers to lifestyle choices and visits with your health care provider that can promote health and wellness. This includes:  A yearly physical exam. This is also called an annual wellness visit.  Regular dental and eye exams.  Immunizations.  Screening for certain conditions.  Healthy lifestyle choices, such as: ? Eating a healthy diet. ? Getting regular exercise. ? Not using drugs or products that contain nicotine and tobacco. ? Limiting alcohol use. What can I expect for my preventive care visit? Physical exam Your health care provider will check your:  Height and weight. These may be used to calculate your BMI (body mass index). BMI is a measurement that tells if you are at a healthy weight.  Heart rate and blood pressure.  Body temperature.  Skin for abnormal spots. Counseling Your health care provider may ask you questions about your:  Past medical problems.  Family's medical history.  Alcohol, tobacco, and drug use.  Emotional well-being.  Home life and relationship well-being.  Sexual activity.  Diet, exercise, and sleep habits.  Work and work Statistician.  Access to firearms. What immunizations do I need? Vaccines are usually given at various ages, according to a schedule. Your health care provider will recommend vaccines for you based on your age, medical history, and lifestyle or other factors, such as travel or where you work.   What tests do I need? Blood tests  Lipid and cholesterol levels. These may be checked every 5 years, or more often if you are over 60 years old.  Hepatitis C test.  Hepatitis B test. Screening  Lung cancer screening. You  may have this screening every year starting at age 53 if you have a 30-pack-year history of smoking and currently smoke or have quit within the past 15 years.  Prostate cancer screening. Recommendations will vary depending on your family history and other risks.  Genital exam to check for testicular cancer or hernias.  Colorectal cancer screening. ? All adults should have this screening starting at age 38 and continuing until age 50. ? Your health care provider may recommend screening at age 11 if you are at increased risk. ? You will have tests every 1-10 years, depending on your results and the type of screening test.  Diabetes screening. ? This is done by checking your blood sugar (glucose) after you have not eaten for a while (fasting). ? You may have this done every 1-3 years.  STD (sexually transmitted disease) testing, if you are at risk. Follow these instructions at home: Eating and drinking  Eat a diet that includes fresh fruits and vegetables, whole grains, lean protein, and low-fat dairy products.  Take vitamin and mineral supplements as recommended by your health care provider.  Do not drink alcohol if your health care provider tells you not to drink.  If you drink alcohol: ? Limit how much you have to 0-2 drinks a day. ? Be aware of how much alcohol is in your drink. In the [place] one drink equals one 12 oz bottle of beer (355 mL), one 5 oz glass of wine (148 mL), or one 1 oz glass of hard liquor (44 mL).  Lifestyle  Take daily care of your teeth and gums. Brush your teeth every morning and night with fluoride toothpaste. Floss one time each day.  Stay active. Exercise for at least 30 minutes 5 or more days each week.  Do not use any products that contain nicotine or tobacco, such as cigarettes, e-cigarettes, and chewing tobacco. If you need help quitting, ask your health care provider.  Do not use drugs.  If you are sexually active, practice safe sex. Use a condom  or other form of protection to prevent STIs (sexually transmitted infections).  If told by your health care provider, take low-dose aspirin daily starting at age 37.  Find healthy ways to cope with stress, such as: ? Meditation, yoga, or listening to music. ? Journaling. ? Talking to a trusted person. ? Spending time with friends and family. Safety  Always wear your seat belt while driving or riding in a vehicle.  Do not drive: ? If you have been drinking alcohol. Do not ride with someone who has been drinking. ? When you are tired or distracted. ? While texting.  Wear a helmet and other protective equipment during sports activities.  If you have firearms in your house, make sure you follow all gun safety procedures. What's next?  Go to your health care provider once a year for an annual wellness visit.  Ask your health care provider how often you should have your eyes and teeth checked.  Stay up to date on all vaccines. This information is not intended to replace advice given to you by your health care provider. Make sure you discuss any questions you have with your health care provider. Document Revised: 08/05/2019 Document Reviewed: 10/31/2018 Elsevier Patient Education  2021 [employer].

## 2021-01-26 NOTE — Assessment & Plan Note (Signed)
Declines influenza and Covid vaccines.  Tetanus vaccine up-to-date.  Commended him on a healthy diet and regular exercise.  Encouraged to continue.  Exam today unremarkable. Labs pending.

## 2021-01-26 NOTE — Progress Notes (Signed)
Subjective:    Patient ID: Julian Randolph, male    DOB: December 06, 1975, 45 y.o.   MRN: [id number]  HPI  Tajuan Dufault a very pleasant 45 y.o. male who presents today for complete physical.   Immunizations: -Tetanus: 2016 -Influenza: Declines  -Covid-19: Declines    Diet: He endorses a healthy diet.  Exercise: Regular exercise at home  Eye exam: Completes annually  Dental exam: Completes semi-annually   Colonoscopy: Completed in 2021, due in 2026  BP Readings from Last 3 Encounters:  01/26/21 124/72  03/08/20 107/73  01/29/20 130/74     Review of Systems  Constitutional: Negative for unexpected weight change.  HENT: Negative for rhinorrhea.   Respiratory: Negative for cough and shortness of breath.   Cardiovascular: Negative for chest pain.  Gastrointestinal: Negative for constipation and diarrhea.  Genitourinary: Negative for difficulty urinating.  Musculoskeletal: Negative for arthralgias and myalgias.  Skin: Negative for rash.  Allergic/Immunologic: Negative for environmental allergies.  Neurological: Negative for dizziness, numbness and headaches.  Psychiatric/Behavioral: The patient is not nervous/anxious.          Past Medical History:  Diagnosis Date  . Allergy   . Chickenpox     Social History   Socioeconomic History  . Marital status: Married    Spouse name: Not on file  . Number of children: Not on file  . Years of education: Not on file  . Highest education level: Not on file  Occupational History  . Not on file  Tobacco Use  . Smoking status: Former Smoker    Packs/day: 0.50    Types: Pipe  . Smokeless tobacco: Former Systems developer    Types: Secondary school teacher  . Vaping Use: Never used  Substance and Sexual Activity  . Alcohol use: Yes    Alcohol/week: 3.0 standard drinks    Types: 3 Standard drinks or equivalent per week    Comment: socially  . Drug use: No  . Sexual activity: Not on file  Other Topics Concern  . Not on file  Social  History Narrative   Married.   2 children.   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   Enjoys spending time with family, golfing.   Social Determinants of Health   Financial Resource Strain: Not on file  Food Insecurity: Not on file  Transportation Needs: Not on file  Physical Activity: Not on file  Stress: Not on file  Social Connections: Not on file  Intimate Partner Violence: Not on file    Past Surgical History:  Procedure Laterality Date  . COLONOSCOPY WITH PROPOFOL N/A 03/08/2020   Procedure: COLONOSCOPY WITH PROPOFOL;  Surgeon: Lin Landsman, MD;  Location: [facility] ENDOSCOPY;  Service: Gastroenterology;  Laterality: N/A;  . ESOPHAGOGASTRODUODENOSCOPY (EGD) WITH PROPOFOL N/A 03/08/2020   Procedure: ESOPHAGOGASTRODUODENOSCOPY (EGD) WITH PROPOFOL;  Surgeon: Lin Landsman, MD;  Location: [facility] ENDOSCOPY;  Service: Gastroenterology;  Laterality: N/A;    Family History  Problem Relation Age of Onset  . Healthy Mother   . Healthy Father   . Colon cancer Maternal Uncle 70    No Known Allergies  No current outpatient medications on file prior to visit.   No current facility-administered medications on file prior to visit.    BP 124/72   Pulse 78   Temp 97.6 F (36.4 C) (Temporal)   Ht 5\' 9"  (1.753 m)   Wt 184 lb (83.5 kg)   SpO2 98%   BMI 27.17 kg/m  Objective:   Physical  Exam        Assessment & Plan:      This visit occurred during the SARS-CoV-2 public health emergency.  Safety protocols were in place, including screening questions prior to the visit, additional usage of staff PPE, and extensive cleaning of exam room while observing appropriate contact time as indicated for disinfecting solutions.

## 2021-04-19 ENCOUNTER — Ambulatory Visit
Admission: EM | Admit: 2021-04-19 | Discharge: 2021-04-19 | Disposition: A | Discharge status: Home / Self Care | Payer: No Typology Code available for payment source | Attending: Emergency Medicine | Admitting: Emergency Medicine

## 2021-04-19 ENCOUNTER — Other Ambulatory Visit: Payer: Self-pay

## 2021-04-19 DIAGNOSIS — L237 Allergic contact dermatitis due to plants, except food: Secondary | ICD-10-CM | POA: Diagnosis not present

## 2021-04-19 MED ORDER — METHYLPREDNISOLONE SODIUM SUCC 125 MG IJ SOLR
80.0000 mg | Freq: Once | INTRAMUSCULAR | Status: AC
  Administered 2021-04-19: 80 mg via INTRAMUSCULAR

## 2021-04-19 MED ORDER — PREDNISONE 10 MG (21) PO TBPK: ORAL_TABLET | Freq: Every day | ORAL | 0 refills | Status: DC

## 2021-04-19 NOTE — ED Provider Notes (Signed)
Roderic Palau    CSN: [id number] Arrival date & time: 04/19/21  1135      History   Chief Complaint Chief Complaint  Patient presents with  . Poison Ivy    HPI Julian Randolph is a 45 y.o. male.   Patient presents with pruritic rash on his arms and abdomen x2 weeks.  The rash started after he came in contact with poison oak or ivy.  Treatment attempted at home with topical cream.  He denies fever, chills, or other symptoms.  No rash in his eyes or mouth.   The history is provided by the patient.    Past Medical History:  Diagnosis Date  . Allergy   . Chickenpox     Patient Active Problem List   Diagnosis Date Noted  . Chronic abdominal pain 10/10/2018  . Hyperlipidemia 06/17/2018  . Preventative health care 06/05/2017  . Elevated blood pressure reading 06/05/2017    Past Surgical History:  Procedure Laterality Date  . COLONOSCOPY WITH PROPOFOL N/A 03/08/2020   Procedure: COLONOSCOPY WITH PROPOFOL;  Surgeon: Lin Landsman, MD;  Location: [facility] ENDOSCOPY;  Service: Gastroenterology;  Laterality: N/A;  . ESOPHAGOGASTRODUODENOSCOPY (EGD) WITH PROPOFOL N/A 03/08/2020   Procedure: ESOPHAGOGASTRODUODENOSCOPY (EGD) WITH PROPOFOL;  Surgeon: Lin Landsman, MD;  Location: [facility] ENDOSCOPY;  Service: Gastroenterology;  Laterality: N/A;       Home Medications    Prior to Admission medications   Medication Sig Start Date End Date Taking? Authorizing Provider  predniSONE (STERAPRED UNI-PAK 21 TAB) 10 MG (21) TBPK tablet Take by mouth daily. As directed 04/20/21  Yes Sharion Balloon, NP    Family History Family History  Problem Relation Age of Onset  . Healthy Mother   . Healthy Father   . Colon cancer Maternal Uncle 51    Social History Social History   Tobacco Use  . Smoking status: Former Smoker    Packs/day: 0.50    Types: Pipe  . Smokeless tobacco: Former Systems developer    Types: Secondary school teacher  . Vaping Use: Never used  Substance Use Topics  .  Alcohol use: Yes    Alcohol/week: 3.0 standard drinks    Types: 3 Standard drinks or equivalent per week    Comment: socially  . Drug use: No     Allergies   Patient has no known allergies.   Review of Systems Review of Systems  Constitutional: Negative for chills and fever.  Respiratory: Negative for cough and shortness of breath.   Cardiovascular: Negative for chest pain and palpitations.  Gastrointestinal: Negative for abdominal pain and vomiting.  Musculoskeletal: Negative for arthralgias and back pain.  Skin: Positive for rash. Negative for color change.  All other systems reviewed and are negative.    Physical Exam Triage Vital Signs ED Triage Vitals  Enc Vitals Group     BP      Pulse      Resp      Temp      Temp src      SpO2      Weight      Height      Head Circumference      Peak Flow      Pain Score      Pain Loc      Pain Edu?      Excl. in [place]?    No data found.  Updated Vital Signs BP 122/79   Pulse 69   Temp 97.9 F (36.6  C)   Resp 19   SpO2 97%   Visual Acuity Right Eye Distance:   Left Eye Distance:   Bilateral Distance:    Right Eye Near:   Left Eye Near:    Bilateral Near:     Physical Exam Vitals and nursing note reviewed.  Constitutional:      General: He is not in acute distress.    Appearance: He is well-developed.  HENT:     Head: Normocephalic and atraumatic.     Mouth/Throat:     Mouth: Mucous membranes are moist.  Eyes:     Conjunctiva/sclera: Conjunctivae normal.  Cardiovascular:     Rate and Rhythm: Normal rate and regular rhythm.     Heart sounds: Normal heart sounds.  Pulmonary:     Effort: Pulmonary effort is normal. No respiratory distress.     Breath sounds: Normal breath sounds.  Abdominal:     Palpations: Abdomen is soft.     Tenderness: There is no abdominal tenderness.  Musculoskeletal:     Cervical back: Neck supple.  Skin:    General: Skin is warm and dry.     Findings: Rash present.      Comments: Erythematous papular, vesicular rash in clusters on arms and abdomen.  Scant clear drainage from some areas.  Neurological:     General: No focal deficit present.     Mental Status: He is alert and oriented to person, place, and time.     Gait: Gait normal.  Psychiatric:        Mood and Affect: Mood normal.        Behavior: Behavior normal.      UC Treatments / Results  Labs (all labs ordered are listed, but only abnormal results are displayed) Labs Reviewed - No data to display  EKG   Radiology No results found.  Procedures Procedures (including critical care time)  Medications Ordered in UC Medications  methylPREDNISolone sodium succinate (SOLU-MEDROL) 125 mg/2 mL injection 80 mg (has no administration in time range)    Initial Impression / Assessment and Plan / UC Course  I have reviewed the triage vital signs and the nursing notes.  Pertinent labs & imaging results that were available during my care of the patient were reviewed by me and considered in my medical decision making (see chart for details).   Allergic dermatitis due to poison oak.  Solu-Medrol given here; starting prednisone taper tomorrow.  Also discussed treatment with Benadryl every 6 hours; precautions for drowsiness with this medication discussed.  Instructed him to follow-up with his PCP if his symptoms are not improving.  He agrees to plan of care.   Final Clinical Impressions(s) / UC Diagnoses   Final diagnoses:  Allergic dermatitis due to poison oak     Discharge Instructions     You were given an injection of a steroid called Solu-Medrol.  Start the prednisone taper tomorrow as directed.    Take Benadryl every 6 hours as directed; do not drive, operate machinery, or drink alcohol with this medication as it may cause drowsiness.  If you need to be awake and alert, take Claritin or Allegra as directed.    Follow up with your primary care provider if your symptoms are not improving.         ED Prescriptions    Medication Sig Dispense Auth. Provider   predniSONE (STERAPRED UNI-PAK 21 TAB) 10 MG (21) TBPK tablet Take by mouth daily. As directed 21 tablet Sharion Balloon,  NP     PDMP not reviewed this encounter.   Sharion Balloon, NP 04/19/21 1227

## 2021-04-19 NOTE — Discharge Instructions (Addendum)
You were given an injection of a steroid called Solu-Medrol.  Start the prednisone taper tomorrow as directed.    Take Benadryl every 6 hours as directed; do not drive, operate machinery, or drink alcohol with this medication as it may cause drowsiness.  If you need to be awake and alert, take Claritin or Allegra as directed.    Follow up with your primary care provider if your symptoms are not improving.

## 2021-04-19 NOTE — Telephone Encounter (Signed)
Called patient back he was seen at urgent care today. Will call if anything further needed. Apologized for the delay in response to his message.

## 2021-04-19 NOTE — Telephone Encounter (Signed)
Needs office visit. Okay for virtual.

## 2021-04-19 NOTE — ED Triage Notes (Signed)
Pt presents with complaints of coming in contact with poison ivy x 2 weeks ago. Reports he thought rash would be better by now but it is spreading. He has it on his arms and along his waist line. Reports using benadryl cream at home.

## 2021-04-26 ENCOUNTER — Encounter: Payer: No Typology Code available for payment source | Admitting: Primary Care

## 2021-07-05 ENCOUNTER — Other Ambulatory Visit: Payer: Self-pay | Admitting: Orthopedic Surgery

## 2021-07-05 DIAGNOSIS — M545 Low back pain, unspecified: Secondary | ICD-10-CM

## 2021-07-13 ENCOUNTER — Encounter: Payer: Self-pay | Admitting: Nurse Practitioner

## 2021-07-13 ENCOUNTER — Ambulatory Visit (INDEPENDENT_AMBULATORY_CARE_PROVIDER_SITE_OTHER): Payer: No Typology Code available for payment source | Admitting: Nurse Practitioner

## 2021-07-13 ENCOUNTER — Other Ambulatory Visit: Payer: Self-pay

## 2021-07-13 VITALS — BP 118/60 | HR 79 | Temp 98.5°F | Resp 12 | Ht 69.0 in | Wt 189.8 lb

## 2021-07-13 DIAGNOSIS — S80861A Insect bite (nonvenomous), right lower leg, initial encounter: Secondary | ICD-10-CM | POA: Insufficient documentation

## 2021-07-13 DIAGNOSIS — W57XXXA Bitten or stung by nonvenomous insect and other nonvenomous arthropods, initial encounter: Secondary | ICD-10-CM | POA: Diagnosis not present

## 2021-07-13 HISTORY — DX: Insect bite (nonvenomous), right lower leg, initial encounter: S80.861A

## 2021-07-13 MED ORDER — MUPIROCIN CALCIUM 2 % EX CREA: 1.0000 "application " | TOPICAL_CREAM | Freq: Two times a day (BID) | CUTANEOUS | 0 refills | Status: DC

## 2021-07-13 NOTE — Assessment & Plan Note (Signed)
Patient states that he pulled a tick from his right hip may 18th 2022.  Since then has had a lesion that will not heal is raised and pink in the center states he keeps knocking it with clothing and belts.  Each time he does it reopens and bleeds.  I could not see a foreign object still embedded in patient's skin.  It is nontender, nonerythematous, and non edematous.  He has having no systemic symptoms.  Did ask patient to try and keep covered when he is up and active we will add mupirocin 2% twice daily to aid in healing.  If it continues to be there and does not heal we did talk about sending dermatology for biopsy for further investigation.  We did discuss about Lyme's disease blood work.  Patient is not having any symptoms after we discussed decided to defer for now.  If he changes his mind in the future he can call and we will place a lab order.  Continue to monitor.

## 2021-07-13 NOTE — Progress Notes (Signed)
Acute Office Visit  Subjective:    Patient ID: Julian Randolph, male    DOB: 05-27-1976, 45 y.o.   MRN: [id number]  Chief Complaint  Patient presents with   Tick bite-not improving    Happened on May 18th 2022, patient pulled tick off but it seems like part of the tick was left. Still has a bump/pimple present. Location is right hip side.    HPI Patient is in today for skin lesion.  States that he pulled tick may 18th 2022. States that the spot is still present. No fever, chills, numbness or tingling. No rash or discharge States that it will bleed when aggravated. He is hitting it with his clothing and belt. He is concerned for a imbedded foreign body.  Past Medical History:  Diagnosis Date   Allergy    Chickenpox     Past Surgical History:  Procedure Laterality Date   COLONOSCOPY WITH PROPOFOL N/A 03/08/2020   Procedure: COLONOSCOPY WITH PROPOFOL;  Surgeon: Lin Landsman, MD;  Location: [facility] ENDOSCOPY;  Service: Gastroenterology;  Laterality: N/A;   ESOPHAGOGASTRODUODENOSCOPY (EGD) WITH PROPOFOL N/A 03/08/2020   Procedure: ESOPHAGOGASTRODUODENOSCOPY (EGD) WITH PROPOFOL;  Surgeon: Lin Landsman, MD;  Location: [facility] ENDOSCOPY;  Service: Gastroenterology;  Laterality: N/A;    Family History  Problem Relation Age of Onset   Healthy Mother    Healthy Father    Colon cancer Maternal Uncle 21    Social History   Socioeconomic History   Marital status: Married    Spouse name: Not on file   Number of children: Not on file   Years of education: Not on file   Highest education level: Not on file  Occupational History   Not on file  Tobacco Use   Smoking status: Former    Packs/day: 0.50    Types: Pipe, Cigarettes   Smokeless tobacco: Former    Types: Nurse, children's Use: Never used  Substance and Sexual Activity   Alcohol use: Yes    Alcohol/week: 3.0 standard drinks    Types: 3 Standard drinks or equivalent per week    Comment: socially    Drug use: No   Sexual activity: Not on file  Other Topics Concern   Not on file  Social History Narrative   Married.   2 children.   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   Enjoys spending time with family, golfing.   Social Determinants of Health   Financial Resource Strain: Not on file  Food Insecurity: Not on file  Transportation Needs: Not on file  Physical Activity: Not on file  Stress: Not on file  Social Connections: Not on file  Intimate Partner Violence: Not on file    Outpatient Medications Prior to Visit  Medication Sig Dispense Refill   predniSONE (STERAPRED UNI-PAK 21 TAB) 10 MG (21) TBPK tablet Take by mouth daily. As directed 21 tablet 0   No facility-administered medications prior to visit.    No Known Allergies  Review of Systems  Constitutional:  Negative for chills, fatigue and fever.  Respiratory:  Negative for shortness of breath.   Cardiovascular:  Negative for chest pain.  Gastrointestinal:  Negative for diarrhea, nausea and vomiting.  Musculoskeletal:  Negative for arthralgias.  Skin:  Positive for wound. Negative for pallor and rash.  Hematological:  Negative for adenopathy.      Objective:    Physical Exam Vitals and nursing note reviewed.  Constitutional:  Appearance: Normal appearance.  Cardiovascular:     Rate and Rhythm: Normal rate and regular rhythm.  Pulmonary:     Effort: Pulmonary effort is normal.     Breath sounds: Normal breath sounds.  Abdominal:     General: Bowel sounds are normal.  Skin:    Coloration: Skin is not pale.     Findings: Lesion present. No erythema or rash.     Comments: Non-tender, raised pitted bump. Has been present for approx 3 months. Located on right anterior hip. Inferior to belt line  Neurological:     Mental Status: He is alert.  Psychiatric:        Mood and Affect: Mood normal.        Behavior: Behavior normal.        Thought Content: Thought content normal.         Judgment: Judgment normal.     BP 118/60   Pulse 79   Temp 98.5 F (36.9 C)   Resp 12   Ht '5\' 9"'$  (1.753 m)   Wt 189 lb 12 oz (86.1 kg)   SpO2 96%   BMI 28.02 kg/m  Wt Readings from Last 3 Encounters:  07/13/21 189 lb 12 oz (86.1 kg)  01/26/21 184 lb (83.5 kg)  03/08/20 180 lb (81.6 kg)    Health Maintenance Due  Topic Date Due   COVID-19 Vaccine (1) Never done   Pneumococcal Vaccine 79-37 Years old (1 - PCV) Never done   INFLUENZA VACCINE  06/20/2021    There are no preventive care reminders to display for this patient.   No results found for: TSH Lab Results  Component Value Date   WBC 5.2 01/26/2021   HGB 14.0 01/26/2021   HCT 40.8 01/26/2021   MCV 87.8 01/26/2021   PLT 188.0 01/26/2021   Lab Results  Component Value Date   NA 137 01/26/2021   K 4.2 01/26/2021   CO2 29 01/26/2021   GLUCOSE 89 01/26/2021   BUN 10 01/26/2021   CREATININE 1.06 01/26/2021   BILITOT 1.0 01/26/2021   ALKPHOS 37 (L) 01/26/2021   AST 18 01/26/2021   ALT 14 01/26/2021   PROT 7.1 01/26/2021   ALBUMIN 4.3 01/26/2021   CALCIUM 9.5 01/26/2021   GFR 83.95 01/26/2021   Lab Results  Component Value Date   CHOL 191 01/26/2021   Lab Results  Component Value Date   HDL 44.30 01/26/2021   Lab Results  Component Value Date   LDLCALC 119 (H) 01/26/2021   Lab Results  Component Value Date   TRIG 138.0 01/26/2021   Lab Results  Component Value Date   CHOLHDL 4 01/26/2021   Lab Results  Component Value Date   HGBA1C 5.2 07/20/2017       Assessment & Plan:   Problem List Items Addressed This Visit       Musculoskeletal and Integument   Bite by nonvenomous insect of hip/thigh/leg/ankle, right, initial encounter - Primary    Patient states that he pulled a tick from his right hip may 18th 2022.  Since then has had a lesion that will not heal is raised and pink in the center states he keeps knocking it with clothing and belts.  Each time he does it reopens and bleeds.  I  could not see a foreign object still embedded in patient's skin.  It is nontender, nonerythematous, and non edematous.  He has having no systemic symptoms.  Did ask patient to try and keep covered  when he is up and active we will add mupirocin 2% twice daily to aid in healing.  If it continues to be there and does not heal we did talk about sending dermatology for biopsy for further investigation.  We did discuss about Lyme's disease blood work.  Patient is not having any symptoms after we discussed decided to defer for now.  If he changes his mind in the future he can call and we will place a lab order.  Continue to monitor.      Relevant Medications   mupirocin cream (BACTROBAN) 2 %     No orders of the defined types were placed in this encounter.  This visit occurred during the SARS-CoV-2 public health emergency.  Safety protocols were in place, including screening questions prior to the visit, additional usage of staff PPE, and extensive cleaning of exam room while observing appropriate contact time as indicated for disinfecting solutions.   Romilda Garret, NP

## 2021-07-13 NOTE — Patient Instructions (Addendum)
Nice to see you today. Use the ointment and keep it covered when we are changing clothes and working. Be sure to keep it uncovered when we are at home or going to sleep at night.  I don't think blood work would be helpful today.

## 2021-07-20 ENCOUNTER — Ambulatory Visit
Admission: RE | Admit: 2021-07-20 | Discharge: 2021-07-20 | Disposition: A | Discharge status: Home / Self Care | Payer: No Typology Code available for payment source | Source: Ambulatory Visit | Attending: Orthopedic Surgery | Admitting: Orthopedic Surgery

## 2021-07-20 DIAGNOSIS — M545 Low back pain, unspecified: Secondary | ICD-10-CM

## 2022-03-20 ENCOUNTER — Ambulatory Visit
Admission: EM | Admit: 2022-03-20 | Discharge: 2022-03-20 | Disposition: A | Discharge status: Home / Self Care | Payer: BC Managed Care – PPO | Attending: Emergency Medicine | Admitting: Emergency Medicine

## 2022-03-20 ENCOUNTER — Encounter: Payer: Self-pay | Admitting: Emergency Medicine

## 2022-03-20 DIAGNOSIS — L237 Allergic contact dermatitis due to plants, except food: Secondary | ICD-10-CM

## 2022-03-20 MED ORDER — METHYLPREDNISOLONE SODIUM SUCC 125 MG IJ SOLR
80.0000 mg | Freq: Once | INTRAMUSCULAR | Status: DC
Start: 2022-03-20 — End: 2022-03-20

## 2022-03-20 MED ORDER — PREDNISONE 10 MG (21) PO TBPK: ORAL_TABLET | Freq: Every day | ORAL | 0 refills | Status: DC

## 2022-03-20 NOTE — Discharge Instructions (Addendum)
Take the prednisone as directed.    Take Zyrtec or Benadryl as directed.    Follow up with your primary care provider if your symptoms are not improving.    

## 2022-03-20 NOTE — ED Provider Notes (Signed)
?[facility] ? ? ? ?CSN: [id number] ?Arrival date & time: 03/20/22  1453 ? ? ?  ? ?History   ?Chief Complaint ?Chief Complaint  ?Patient presents with  ? Poison Ivy  ? ? ?HPI ?Nestor Wieneke is a 46 y.o. male.  Patient presents with 5 day history of pruritic rash on his arms and face.  No rash in his eyes or mouth.  The rash started after he was working in the woods and came in contact with poison ivy.  Treatment at home with poison ivy cream.  No fever, chills, or other symptoms. ? ?The history is provided by the patient and medical records.  ? ?Past Medical History:  ?Diagnosis Date  ? Allergy   ? Chickenpox   ? ? ?Patient Active Problem List  ? Diagnosis Date Noted  ? Bite by nonvenomous insect of hip/thigh/leg/ankle, right, initial encounter 07/13/2021  ? Chronic abdominal pain 10/10/2018  ? Hyperlipidemia 06/17/2018  ? Preventative health care 06/05/2017  ? Elevated blood pressure reading 06/05/2017  ? ? ?Past Surgical History:  ?Procedure Laterality Date  ? COLONOSCOPY WITH PROPOFOL N/A 03/08/2020  ? Procedure: COLONOSCOPY WITH PROPOFOL;  Surgeon: Lin Landsman, MD;  Location: [facility] ENDOSCOPY;  Service: Gastroenterology;  Laterality: N/A;  ? ESOPHAGOGASTRODUODENOSCOPY (EGD) WITH PROPOFOL N/A 03/08/2020  ? Procedure: ESOPHAGOGASTRODUODENOSCOPY (EGD) WITH PROPOFOL;  Surgeon: Lin Landsman, MD;  Location: [facility] ENDOSCOPY;  Service: Gastroenterology;  Laterality: N/A;  ? ? ? ? ? ?Home Medications   ? ?Prior to Admission medications   ?Medication Sig Start Date End Date Taking? Authorizing Provider  ?predniSONE (STERAPRED UNI-PAK 21 TAB) 10 MG (21) TBPK tablet Take by mouth daily. As directed 03/21/22  Yes Sharion Balloon, NP  ?mupirocin cream (BACTROBAN) 2 % Apply 1 application topically 2 (two) times daily. 07/13/21   Michela Pitcher, NP  ? ? ?Family History ?Family History  ?Problem Relation Age of Onset  ? Healthy Mother   ? Healthy Father   ? Colon cancer Maternal Uncle 32  ? ? ?Social  History ?Social History  ? ?Tobacco Use  ? Smoking status: Former  ?  Packs/day: 0.50  ?  Types: Pipe, Cigarettes  ? Smokeless tobacco: Former  ?  Types: Chew  ?Vaping Use  ? Vaping Use: Never used  ?Substance Use Topics  ? Alcohol use: Yes  ?  Alcohol/week: 3.0 standard drinks  ?  Types: 3 Standard drinks or equivalent per week  ?  Comment: socially  ? Drug use: No  ? ? ? ?Allergies   ?Patient has no known allergies. ? ? ?Review of Systems ?Review of Systems  ?Constitutional:  Negative for chills and fever.  ?Skin:  Positive for rash. Negative for color change.  ?All other systems reviewed and are negative. ? ? ?Physical Exam ?Triage Vital Signs ?ED Triage Vitals  ?Enc Vitals Group  ?   BP   ?   Pulse   ?   Resp   ?   Temp   ?   Temp src   ?   SpO2   ?   Weight   ?   Height   ?   Head Circumference   ?   Peak Flow   ?   Pain Score   ?   Pain Loc   ?   Pain Edu?   ?   Excl. in [place]?   ? ?No data found. ? ?Updated Vital Signs ?BP 138/78   Pulse 66  Temp 98.1 ?F (36.7 ?C)   Resp 18   SpO2 96%  ? ?Visual Acuity ?Right Eye Distance:   ?Left Eye Distance:   ?Bilateral Distance:   ? ?Right Eye Near:   ?Left Eye Near:    ?Bilateral Near:    ? ?Physical Exam ?Vitals and nursing note reviewed.  ?Constitutional:   ?   General: He is not in acute distress. ?   Appearance: Normal appearance. He is well-developed. He is not ill-appearing.  ?HENT:  ?   Mouth/Throat:  ?   Mouth: Mucous membranes are moist.  ?   Pharynx: Oropharynx is clear.  ?Cardiovascular:  ?   Rate and Rhythm: Normal rate and regular rhythm.  ?   Heart sounds: Normal heart sounds.  ?Pulmonary:  ?   Effort: Pulmonary effort is normal. No respiratory distress.  ?   Breath sounds: Normal breath sounds.  ?Musculoskeletal:  ?   Cervical back: Neck supple.  ?Skin: ?   General: Skin is warm and dry.  ?   Findings: Rash present.  ?   Comments: Pink papular and patchy rash on arms and forehead.  No lesions in mouth or eyes.  ?Neurological:  ?   Mental Status: He is  alert.  ?Psychiatric:     ?   Mood and Affect: Mood normal.     ?   Behavior: Behavior normal.  ? ? ? ?UC Treatments / Results  ?Labs ?(all labs ordered are listed, but only abnormal results are displayed) ?Labs Reviewed - No data to display ? ?EKG ? ? ?Radiology ?No results found. ? ?Procedures ?Procedures (including critical care time) ? ?Medications Ordered in UC ?Medications - No data to display ? ? ?Initial Impression / Assessment and Plan / UC Course  ?I have reviewed the triage vital signs and the nursing notes. ? ?Pertinent labs & imaging results that were available during my care of the patient were reviewed by me and considered in my medical decision making (see chart for details). ? ?  ?Poison Ivy dermatitis.  Patient declines Solu-Medrol injection today.  Treating with prednisone taper.  Instructed patient to take Zyrtec or Benadryl also.  Education provided on poison ivy dermatitis.  Instructed him to follow-up with his PCP if his symptoms are not improving.  He agrees to plan of care. ? ?Final Clinical Impressions(s) / UC Diagnoses  ? ?Final diagnoses:  ?Poison ivy dermatitis  ? ? ? ?Discharge Instructions   ? ?  ?Take the prednisone as directed.   ? ?Take Zyrtec or Benadryl as directed ? ?Follow-up with your primary care provider if your symptoms are not improving. ? ? ? ? ?ED Prescriptions   ? ? Medication Sig Dispense Auth. Provider  ? predniSONE (STERAPRED UNI-PAK 21 TAB) 10 MG (21) TBPK tablet Take by mouth daily. As directed 21 tablet Sharion Balloon, NP  ? ?  ? ?PDMP not reviewed this encounter. ?  ?Sharion Balloon, NP ?03/20/22 1542 ? ?

## 2022-03-20 NOTE — ED Triage Notes (Signed)
Pt presents with poison oak on his face and arms x 5 days  ?

## 2022-03-31 ENCOUNTER — Ambulatory Visit
Admission: EM | Admit: 2022-03-31 | Discharge: 2022-03-31 | Disposition: A | Discharge status: Home / Self Care | Payer: BC Managed Care – PPO | Attending: Emergency Medicine | Admitting: Emergency Medicine

## 2022-03-31 DIAGNOSIS — L237 Allergic contact dermatitis due to plants, except food: Secondary | ICD-10-CM | POA: Diagnosis not present

## 2022-03-31 MED ORDER — PREDNISONE 10 MG (21) PO TBPK: ORAL_TABLET | Freq: Every day | ORAL | 0 refills | Status: DC

## 2022-03-31 MED ORDER — METHYLPREDNISOLONE SODIUM SUCC 125 MG IJ SOLR
60.0000 mg | Freq: Once | INTRAMUSCULAR | Status: AC
  Administered 2022-03-31: 60 mg via INTRAMUSCULAR

## 2022-03-31 NOTE — ED Triage Notes (Signed)
Patient presents to Urgent Care with complaints of poison ivy rash since last visit. He state he was seen 05/01 no improvement with prednisone.  ?

## 2022-03-31 NOTE — Discharge Instructions (Addendum)
It isToday you have been given an injection in office to help stop the inflammatory process associated with your rash ? ?Starting tomorrow begin prednisone course every morning with food as directed on packaging ? ?You may use the spray that you have purchased for additional comfort ? ?If problems worsen you may use over-the-counter Zyrtec, Benadryl or Claritin, may also use topical Benadryl or calamine lotion for further management ? ?If rash continues to persist past use of medication please return to urgent care for reevaluation of symptoms, please watch for signs of infection such as increased swelling, increased tenderness, puslike drainage, fever or chills, if this occurs at any point please return to urgent care for reevaluation for infection ?

## 2022-03-31 NOTE — ED Provider Notes (Signed)
?[facility] ? ? ? ?CSN: [id number] ?Arrival date & time: 03/31/22  1021 ? ? ?  ? ?History   ?Chief Complaint ?Chief Complaint  ?Patient presents with  ? Rash  ? Poison Ivy  ? ? ?HPI ?Julian Randolph is a 46 y.o. male.  ? ?Patient presents with rash to the left arm, bilateral hands, bilateral feet, lower abdomen and posterior neck for 17 days.  Rash is mildly pruritic, nondraining.  Was seen in urgent care 11 days ago and prescribed 6-day prednisone course, patient endorses medication once somewhat helpful but once discontinued symptoms reoccurred.  Has not attempted further treatment.  Denies fever, chills. ? ?Past Medical History:  ?Diagnosis Date  ? Allergy   ? Chickenpox   ? ? ?Patient Active Problem List  ? Diagnosis Date Noted  ? Bite by nonvenomous insect of hip/thigh/leg/ankle, right, initial encounter 07/13/2021  ? Chronic abdominal pain 10/10/2018  ? Hyperlipidemia 06/17/2018  ? Preventative health care 06/05/2017  ? Elevated blood pressure reading 06/05/2017  ? ? ?Past Surgical History:  ?Procedure Laterality Date  ? COLONOSCOPY WITH PROPOFOL N/A 03/08/2020  ? Procedure: COLONOSCOPY WITH PROPOFOL;  Surgeon: Lin Landsman, MD;  Location: [facility] ENDOSCOPY;  Service: Gastroenterology;  Laterality: N/A;  ? ESOPHAGOGASTRODUODENOSCOPY (EGD) WITH PROPOFOL N/A 03/08/2020  ? Procedure: ESOPHAGOGASTRODUODENOSCOPY (EGD) WITH PROPOFOL;  Surgeon: Lin Landsman, MD;  Location: [facility] ENDOSCOPY;  Service: Gastroenterology;  Laterality: N/A;  ? ? ? ? ? ?Home Medications   ? ?Prior to Admission medications   ?Medication Sig Start Date End Date Taking? Authorizing Provider  ?predniSONE (STERAPRED UNI-PAK 21 TAB) 10 MG (21) TBPK tablet Take by mouth daily. Take 6 tabs by mouth daily  for 2 days, then 5 tabs for 2 days, then 4 tabs for 2 days, then 3 tabs for 2 days, 2 tabs for 2 days, then 1 tab by mouth daily for 2 days 03/31/22  Yes Cieanna Stormes, Vincente Liberty R, NP  ?mupirocin cream (BACTROBAN) 2 % Apply 1 application  topically 2 (two) times daily. 07/13/21   Michela Pitcher, NP  ? ? ?Family History ?Family History  ?Problem Relation Age of Onset  ? Healthy Mother   ? Healthy Father   ? Colon cancer Maternal Uncle 65  ? ? ?Social History ?Social History  ? ?Tobacco Use  ? Smoking status: Former  ?  Packs/day: 0.50  ?  Types: Pipe, Cigarettes  ? Smokeless tobacco: Former  ?  Types: Chew  ?Vaping Use  ? Vaping Use: Never used  ?Substance Use Topics  ? Alcohol use: Yes  ?  Alcohol/week: 3.0 standard drinks  ?  Types: 3 Standard drinks or equivalent per week  ?  Comment: socially  ? Drug use: No  ? ? ? ?Allergies   ?Patient has no known allergies. ? ? ?Review of Systems ?Review of Systems  ?Constitutional: Negative.   ?Respiratory: Negative.    ?Cardiovascular: Negative.   ?Skin:  Positive for rash. Negative for color change, pallor and wound.  ? ? ?Physical Exam ?Triage Vital Signs ?ED Triage Vitals  ?Enc Vitals Group  ?   BP 03/31/22 1029 128/83  ?   Pulse Rate 03/31/22 1029 83  ?   Resp 03/31/22 1029 16  ?   Temp 03/31/22 1029 98.1 ?F (36.7 ?C)  ?   Temp Source 03/31/22 1029 Temporal  ?   SpO2 03/31/22 1029 96 %  ?   Weight --   ?   Height --   ?  Head Circumference --   ?   Peak Flow --   ?   Pain Score 03/31/22 1031 0  ?   Pain Loc --   ?   Pain Edu? --   ?   Excl. in [place]? --   ? ?No data found. ? ?Updated Vital Signs ?BP 128/83 (BP Location: Left Arm)   Pulse 83   Temp 98.1 ?F (36.7 ?C) (Temporal)   Resp 16   SpO2 96%  ? ?Visual Acuity ?Right Eye Distance:   ?Left Eye Distance:   ?Bilateral Distance:   ? ?Right Eye Near:   ?Left Eye Near:    ?Bilateral Near:    ? ?Physical Exam ?Constitutional:   ?   Appearance: Normal appearance.  ?HENT:  ?   Head: Normocephalic.  ?Eyes:  ?   Extraocular Movements: Extraocular movements intact.  ?Musculoskeletal:  ?   Comments: Thematous papular blisterlike rash present to the flexor of the left arm, the palmar aspect of the bilateral hands, the right forearm, the bilateral feet, lower  abdomen and posterior neck  ?Neurological:  ?   Mental Status: He is alert and oriented to person, place, and time. Mental status is at baseline.  ?Psychiatric:     ?   Mood and Affect: Mood normal.     ?   Behavior: Behavior normal.  ? ? ? ?UC Treatments / Results  ?Labs ?(all labs ordered are listed, but only abnormal results are displayed) ?Labs Reviewed - No data to display ? ?EKG ? ? ?Radiology ?No results found. ? ?Procedures ?Procedures (including critical care time) ? ?Medications Ordered in UC ?Medications  ?methylPREDNISolone sodium succinate (SOLU-MEDROL) 125 mg/2 mL injection 60 mg (60 mg Intramuscular Given 03/31/22 1054)  ? ? ?Initial Impression / Assessment and Plan / UC Course  ?I have reviewed the triage vital signs and the nursing notes. ? ?Pertinent labs & imaging results that were available during my care of the patient were reviewed by me and considered in my medical decision making (see chart for details). ? ?Poison ivy dermatitis ? ?Methylprednisolone injection given in office, prednisone 60 mg taper prescribed for persistent symptoms, may use oral or topical antihistamines as well as calamine lotion if pruritus becomes worrisome, given strict precautions to return if no improvement seen ?Final Clinical Impressions(s) / UC Diagnoses  ? ?Final diagnoses:  ?Poison ivy dermatitis  ? ? ? ?Discharge Instructions   ? ?  ?It isToday you have been given an injection in office to help stop the inflammatory process associated with your rash ? ?Starting tomorrow begin prednisone course every morning with food as directed on packaging ? ?You may use the spray that you have purchased for additional comfort ? ?If problems worsen you may use over-the-counter Zyrtec, Benadryl or Claritin, may also use topical Benadryl or calamine lotion for further management ? ?If rash continues to persist past use of medication please return to urgent care for reevaluation of symptoms, please watch for signs of infection  such as increased swelling, increased tenderness, puslike drainage, fever or chills, if this occurs at any point please return to urgent care for reevaluation for infection ? ? ?ED Prescriptions   ? ? Medication Sig Dispense Auth. Provider  ? predniSONE (STERAPRED UNI-PAK 21 TAB) 10 MG (21) TBPK tablet Take by mouth daily. Take 6 tabs by mouth daily  for 2 days, then 5 tabs for 2 days, then 4 tabs for 2 days, then 3 tabs for 2 days, 2 tabs  for 2 days, then 1 tab by mouth daily for 2 days 42 tablet Khalia Gong, Leitha Schuller, NP  ? ?  ? ?PDMP not reviewed this encounter. ?  ?Hans Eden, NP ?03/31/22 1057 ? ?

## 2022-11-09 ENCOUNTER — Encounter: Payer: BC Managed Care – PPO | Admitting: Primary Care

## 2022-11-22 ENCOUNTER — Encounter: Payer: BC Managed Care – PPO | Admitting: Primary Care

## 2022-12-01 ENCOUNTER — Ambulatory Visit (INDEPENDENT_AMBULATORY_CARE_PROVIDER_SITE_OTHER): Payer: BC Managed Care – PPO | Admitting: Primary Care

## 2022-12-01 ENCOUNTER — Encounter: Payer: Self-pay | Admitting: Primary Care

## 2022-12-01 VITALS — BP 124/80 | HR 78 | Temp 96.7°F | Ht 69.0 in | Wt 198.0 lb

## 2022-12-01 DIAGNOSIS — E782 Mixed hyperlipidemia: Secondary | ICD-10-CM | POA: Diagnosis not present

## 2022-12-01 DIAGNOSIS — Z0001 Encounter for general adult medical examination with abnormal findings: Secondary | ICD-10-CM

## 2022-12-01 DIAGNOSIS — G8929 Other chronic pain: Secondary | ICD-10-CM

## 2022-12-01 DIAGNOSIS — M545 Low back pain, unspecified: Secondary | ICD-10-CM

## 2022-12-01 DIAGNOSIS — R109 Unspecified abdominal pain: Secondary | ICD-10-CM

## 2022-12-01 NOTE — Assessment & Plan Note (Signed)
Unlikely renal stone, especially given location of pain.  UA pending. Exam today reassuring.

## 2022-12-01 NOTE — Progress Notes (Signed)
Subjective:    Patient ID: Julian Randolph, male    DOB: 1976-02-12, 47 y.o.   MRN: [id number]  HPI  Julian Randolph is a very pleasant 47 y.o. male  has a past medical history of Allergy, Bite by nonvenomous insect of hip/thigh/leg/ankle, right, initial encounter (07/13/2021), Chickenpox, and Elevated blood pressure reading (06/05/2017). who presents today for complete physical and follow up of chronic conditions.  He would also like to discuss right flank pain. Symptoms onset about 4 months for which he describes as dull/achy. He denies difficulty urinating, hematuria, radiation of pain, difficulty urinating. He questions if his symptoms are secondary to a kidney stone. He has no prior history of kidney stone. He can provoke his pain with certain movements.   Immunizations: -Tetanus: Completed in 2016 -Influenza: Declines this season  Diet: [place].  Exercise: No regular exercise. Sometimes push ups and weight resistance.   Eye exam: Completed 3-4 years ago  Dental exam: Completes annually   Colonoscopy: Completed in 2021, due 2026  BP Readings from Last 3 Encounters:  12/01/22 124/80  03/31/22 128/83  03/20/22 138/78   Wt Readings from Last 3 Encounters:  12/01/22 198 lb (89.8 kg)  07/13/21 189 lb 12 oz (86.1 kg)  01/26/21 184 lb (83.5 kg)     Review of Systems  Constitutional:  Negative for unexpected weight change.  HENT:  Negative for rhinorrhea.   Eyes:  Negative for visual disturbance.  Respiratory:  Negative for cough and shortness of breath.   Cardiovascular:  Negative for chest pain.  Gastrointestinal:  Negative for constipation and diarrhea.  Genitourinary:  Positive for flank pain. Negative for difficulty urinating, frequency, hematuria and urgency.  Musculoskeletal:  Positive for back pain. Negative for arthralgias and myalgias.  Skin:  Negative for rash.  Allergic/Immunologic: Negative for environmental allergies.  Neurological:  Negative for  dizziness and headaches.  Psychiatric/Behavioral:  The patient is not nervous/anxious.          Past Medical History:  Diagnosis Date   Allergy    Bite by nonvenomous insect of hip/thigh/leg/ankle, right, initial encounter 07/13/2021   Chickenpox    Elevated blood pressure reading 06/05/2017    Social History   Socioeconomic History   Marital status: Married    Spouse name: Not on file   Number of children: Not on file   Years of education: Not on file   Highest education level: Not on file  Occupational History   Not on file  Tobacco Use   Smoking status: Former    Packs/day: 0.50    Types: Pipe, Cigarettes   Smokeless tobacco: Former    Types: Nurse, children's Use: Never used  Substance and Sexual Activity   Alcohol use: Yes    Alcohol/week: 3.0 standard drinks of alcohol    Types: 3 Standard drinks or equivalent per week    Comment: socially   Drug use: No   Sexual activity: Not on file  Other Topics Concern   Not on file  Social History Narrative   Married.   2 children.   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   Enjoys spending time with family, golfing.   Social Determinants of Health   Financial Resource Strain: Not on file  Food Insecurity: Not on file  Transportation Needs: Not on file  Physical Activity: Not on file  Stress: Not on file  Social Connections: Not on file  Intimate Partner Violence: Not on file  Past Surgical History:  Procedure Laterality Date   COLONOSCOPY WITH PROPOFOL N/A 03/08/2020   Procedure: COLONOSCOPY WITH PROPOFOL;  Surgeon: Lin Landsman, MD;  Location: [facility] ENDOSCOPY;  Service: Gastroenterology;  Laterality: N/A;   ESOPHAGOGASTRODUODENOSCOPY (EGD) WITH PROPOFOL N/A 03/08/2020   Procedure: ESOPHAGOGASTRODUODENOSCOPY (EGD) WITH PROPOFOL;  Surgeon: Lin Landsman, MD;  Location: [facility] ENDOSCOPY;  Service: Gastroenterology;  Laterality: N/A;    Family History  Problem Relation Age of  Onset   Healthy Mother    Healthy Father    Colon cancer Maternal Uncle 85    No Known Allergies  Current Outpatient Medications on File Prior to Visit  Medication Sig Dispense Refill   methocarbamol (ROBAXIN) 750 MG tablet Take 750 mg by mouth 2 (two) times daily as needed for muscle spasms.     No current facility-administered medications on file prior to visit.    BP 124/80   Pulse 78   Temp (!) 96.7 F (35.9 C) (Temporal)   Ht '5\' 9"'$  (1.753 m)   Wt 198 lb (89.8 kg)   SpO2 99%   BMI 29.24 kg/m  Objective:   Physical Exam HENT:     Right Ear: Tympanic membrane and ear canal normal.     Left Ear: Tympanic membrane and ear canal normal.     Nose: Nose normal.     Right Sinus: No maxillary sinus tenderness or frontal sinus tenderness.     Left Sinus: No maxillary sinus tenderness or frontal sinus tenderness.  Eyes:     Conjunctiva/sclera: Conjunctivae normal.  Neck:     Thyroid: No thyromegaly.     Vascular: No carotid bruit.  Cardiovascular:     Rate and Rhythm: Normal rate and regular rhythm.     Heart sounds: Normal heart sounds.  Pulmonary:     Effort: Pulmonary effort is normal.     Breath sounds: Normal breath sounds. No wheezing or rales.  Abdominal:     General: Bowel sounds are normal.     Palpations: Abdomen is soft.     Tenderness: There is no abdominal tenderness. There is no right CVA tenderness or left CVA tenderness.  Musculoskeletal:        General: Normal range of motion.     Cervical back: Neck supple.     Thoracic back: No spasms or tenderness. Normal range of motion.       Back:  Skin:    General: Skin is warm and dry.  Neurological:     Mental Status: He is alert and oriented to person, place, and time.     Cranial Nerves: No cranial nerve deficit.     Deep Tendon Reflexes: Reflexes are normal and symmetric.  Psychiatric:        Mood and Affect: Mood normal.           Assessment & Plan:  Chronic midline low back pain without  sciatica Assessment & Plan: Controlled.  Continue intermittent use of methocarbamol 750 mg.    Flank pain Assessment & Plan: Unlikely renal stone, especially given location of pain.  UA pending. Exam today reassuring.  Orders: -     Urinalysis, Routine w reflex microscopic  Mixed hyperlipidemia Assessment & Plan: Discussed the importance of a healthy diet and regular exercise in order for weight loss, and to reduce the risk of further co-morbidity.  Repeat lipid panel pending.  Orders: -     Lipid panel -     Comprehensive metabolic panel  Encounter for annual  general medical examination with abnormal findings in adult Assessment & Plan: Immunizations UTD. Declines influenza vaccine. Colonoscopy UTD, due 2026.  Discussed the importance of a healthy diet and regular exercise in order for weight loss, and to reduce the risk of further co-morbidity.  Exam stable. Labs pending.  Follow up in 1 year for repeat physical.          [name], NP

## 2022-12-01 NOTE — Assessment & Plan Note (Signed)
Immunizations UTD. Declines influenza vaccine. Colonoscopy UTD, due 2026.  Discussed the importance of a healthy diet and regular exercise in order for weight loss, and to reduce the risk of further co-morbidity.  Exam stable. Labs pending.  Follow up in 1 year for repeat physical.

## 2022-12-01 NOTE — Patient Instructions (Signed)
Stop by the lab prior to leaving today. I will notify you of your results once received.   It was a pleasure to see you today!  

## 2022-12-01 NOTE — Assessment & Plan Note (Signed)
Discussed the importance of a healthy diet and regular exercise in order for weight loss, and to reduce the risk of further co-morbidity.  Repeat lipid panel pending. 

## 2022-12-01 NOTE — Assessment & Plan Note (Signed)
Controlled.  Continue intermittent use of methocarbamol 750 mg.

## 2022-12-02 LAB — COMPREHENSIVE METABOLIC PANEL
AG Ratio: 1.7 (calc) (ref 1.0–2.5)
ALT: 21 U/L (ref 9–46)
AST: 21 U/L (ref 10–40)
Albumin: 4.3 g/dL (ref 3.6–5.1)
Alkaline phosphatase (APISO): 45 U/L (ref 36–130)
BUN: 14 mg/dL (ref 7–25)
CO2: 22 mmol/L (ref 20–32)
Calcium: 9.1 mg/dL (ref 8.6–10.3)
Chloride: 106 mmol/L (ref 98–110)
Creat: 1.04 mg/dL (ref 0.60–1.29)
Globulin: 2.5 g/dL (calc) (ref 1.9–3.7)
Glucose, Bld: 104 mg/dL — ABNORMAL HIGH (ref 65–99)
Potassium: 4.4 mmol/L (ref 3.5–5.3)
Sodium: 140 mmol/L (ref 135–146)
Total Bilirubin: 0.7 mg/dL (ref 0.2–1.2)
Total Protein: 6.8 g/dL (ref 6.1–8.1)

## 2022-12-02 LAB — LIPID PANEL
Cholesterol: 218 mg/dL — ABNORMAL HIGH (ref <200)
HDL: 45 mg/dL (ref >=40)
LDL Cholesterol (Calc): 129 mg/dL (calc) — ABNORMAL HIGH
Non-HDL Cholesterol (Calc): 173 mg/dL (calc) — ABNORMAL HIGH (ref <130)
Total CHOL/HDL Ratio: 4.8 (calc) (ref <5.0)
Triglycerides: 307 mg/dL — ABNORMAL HIGH (ref <150)

## 2022-12-02 LAB — TIQ-MISC

## 2022-12-05 NOTE — Addendum Note (Signed)
Addended by: Ellamae Sia on: 12/05/2022 02:16 PM   Modules accepted: Orders

## 2022-12-06 LAB — URINALYSIS, ROUTINE W REFLEX MICROSCOPIC
Hgb urine dipstick: NEGATIVE
Ketones, ur: 15 — AB
Leukocytes,Ua: NEGATIVE
Nitrite: NEGATIVE
Specific Gravity, Urine: 1.025 (ref 1.000–1.030)
Total Protein, Urine: NEGATIVE
Urine Glucose: NEGATIVE
Urobilinogen, UA: 1 (ref 0.0–1.0)
pH: 6.5 (ref 5.0–8.0)

## 2023-03-25 ENCOUNTER — Ambulatory Visit
Admission: EM | Admit: 2023-03-25 | Discharge: 2023-03-25 | Disposition: A | Discharge status: Home / Self Care | Payer: BC Managed Care – PPO | Attending: Nurse Practitioner | Admitting: Nurse Practitioner

## 2023-03-25 DIAGNOSIS — L237 Allergic contact dermatitis due to plants, except food: Secondary | ICD-10-CM | POA: Diagnosis not present

## 2023-03-25 MED ORDER — PREDNISONE 10 MG (21) PO TBPK: ORAL_TABLET | Freq: Every day | ORAL | 0 refills | Status: DC

## 2023-03-25 MED ORDER — METHYLPREDNISOLONE ACETATE 40 MG/ML IJ SUSP
40.0000 mg | Freq: Once | INTRAMUSCULAR | Status: AC
  Administered 2023-03-25: 40 mg via INTRAMUSCULAR

## 2023-03-25 NOTE — ED Triage Notes (Signed)
Patient to Urgent Care with complaints of blister-like rash following poison ivy exposure. Rash present to bilateral arms/ belt line.  Reports symptoms started 12-14 days ago. Has been using baking soda/ salt/ witch hazel which dried out the initial area. Rash has spread.

## 2023-03-25 NOTE — ED Provider Notes (Signed)
Julian Randolph    CSN: [id number] Arrival date & time: 03/25/23  4098      History   Chief Complaint Chief Complaint  Patient presents with   Poison Ivy    HPI Julian Randolph is a 47 y.o. male presents for evaluation of a rash.  Patient reports 2 weeks ago while mowing he came in contact with poison ivy.  He states he developed a vesicular rash along both forearms, belt line, and neck.  He has been treating it with a homemade paste made of baking soda, salt, and which hazel but he feels the rash is spreading.  No fevers or chills.  No chest pain or shortness of breath.  States he gets this every year.  No other OTC treatments have been used.  No other concerns at this time.   Poison Ivy    Past Medical History:  Diagnosis Date   Allergy    Bite by nonvenomous insect of hip/thigh/leg/ankle, right, initial encounter 07/13/2021   Chickenpox    Elevated blood pressure reading 06/05/2017    Patient Active Problem List   Diagnosis Date Noted   Chronic back pain 12/01/2022   Flank pain 12/01/2022   Chronic abdominal pain 10/10/2018   Hyperlipidemia 06/17/2018   Encounter for annual general medical examination with abnormal findings in adult 06/05/2017    Past Surgical History:  Procedure Laterality Date   COLONOSCOPY WITH PROPOFOL N/A 03/08/2020   Procedure: COLONOSCOPY WITH PROPOFOL;  Surgeon: Toney Reil, MD;  Location: [facility] ENDOSCOPY;  Service: Gastroenterology;  Laterality: N/A;   ESOPHAGOGASTRODUODENOSCOPY (EGD) WITH PROPOFOL N/A 03/08/2020   Procedure: ESOPHAGOGASTRODUODENOSCOPY (EGD) WITH PROPOFOL;  Surgeon: Toney Reil, MD;  Location: [facility] ENDOSCOPY;  Service: Gastroenterology;  Laterality: N/A;       Home Medications    Prior to Admission medications   Medication Sig Start Date End Date Taking? Authorizing Provider  predniSONE (STERAPRED UNI-PAK 21 TAB) 10 MG (21) TBPK tablet Take by mouth daily. Take 6 tabs by mouth daily  for 1 day,  then 5 tabs for 1 day, then 4 tabs for 1 day, then 3 tabs for 1 day, 2 tabs for 1 day, then 1 tab by mouth daily for 1 days 03/26/23  Yes Radford Pax, NP  methocarbamol (ROBAXIN) 750 MG tablet Take 750 mg by mouth 2 (two) times daily as needed for muscle spasms.    [provider]    Family History Family History  Problem Relation Age of Onset   Healthy Mother    Healthy Father    Colon cancer Maternal Uncle 25    Social History Social History   Tobacco Use   Smoking status: Former    Packs/day: .5    Types: Pipe, Cigarettes   Smokeless tobacco: Former    Types: Associate Professor Use: Never used  Substance Use Topics   Alcohol use: Yes    Alcohol/week: 3.0 standard drinks of alcohol    Types: 3 Standard drinks or equivalent per week    Comment: socially   Drug use: No     Allergies   Patient has no known allergies.   Review of Systems Review of Systems  Skin:  Positive for rash.     Physical Exam Triage Vital Signs ED Triage Vitals  Enc Vitals Group     BP 03/25/23 0847 130/86     Pulse Rate 03/25/23 0847 74     Resp 03/25/23 0847 18  Temp 03/25/23 0847 97.9 F (36.6 C)     Temp src --      SpO2 03/25/23 0847 96 %     Weight --      Height --      Head Circumference --      Peak Flow --      Pain Score 03/25/23 0846 0     Pain Loc --      Pain Edu? --      Excl. in GC? --    No data found.  Updated Vital Signs BP 130/86   Pulse 74   Temp 97.9 F (36.6 C)   Resp 18   SpO2 96%   Visual Acuity Right Eye Distance:   Left Eye Distance:   Bilateral Distance:    Right Eye Near:   Left Eye Near:    Bilateral Near:     Physical Exam Vitals and nursing note reviewed.  Constitutional:      General: He is not in acute distress.    Appearance: Normal appearance. He is not ill-appearing.  HENT:     Head: Normocephalic and atraumatic.  Eyes:     Extraocular Movements: Extraocular movements intact.     Conjunctiva/sclera:  Conjunctivae normal.     Pupils: Pupils are equal, round, and reactive to light.  Cardiovascular:     Rate and Rhythm: Normal rate.  Pulmonary:     Effort: Pulmonary effort is normal.  Skin:    General: Skin is warm and dry.     Findings: Rash present. Rash is vesicular.     Comments: Scattered linear vesicular rash along bilateral forearms that extends to upper arms and beltline.  No drainage, swelling, warmth, erythema  Neurological:     General: No focal deficit present.     Mental Status: He is alert and oriented to person, place, and time.  Psychiatric:        Mood and Affect: Mood normal.        Behavior: Behavior normal.      UC Treatments / Results  Labs (all labs ordered are listed, but only abnormal results are displayed) Labs Reviewed - No data to display  Comprehensive metabolic panel Order: [id number] Status: Final result               Component Ref Range & Units 3 mo ago (12/01/22) 2 yr ago (01/26/21) 3 yr ago (12/18/19) 4 yr ago (10/10/18) 4 yr ago (09/17/18) 4 yr ago (06/04/18) 5 yr ago (06/06/17)  Glucose, Bld 65 - 99 mg/dL 829 High  89 R 562 High  R 66 Low  R  95 R 102 High  R  Comment: .            Fasting reference interval . For someone without known diabetes, a glucose value between 100 and 125 mg/dL is consistent with prediabetes and should be confirmed with a follow-up test. .  BUN 7 - 25 mg/dL 14 10 R 10 R 11 R  13 R 12 R  Creat 0.60 - 1.29 mg/dL 1.30 8.65 R 7.84 R 6.96 R  1.12 R 1.21 R  BUN/Creatinine Ratio 6 - 22 (calc) SEE NOTE:        Comment:    Not Reported: BUN and Creatinine are within    reference range. .  Sodium 135 - 146 mmol/L 140 137 R 140 R 138 R  138 R 137 R  Potassium 3.5 - 5.3 mmol/L 4.4 4.2 R 4.6 R  4.3 R  4.2 R 4.6 R  Chloride 98 - 110 mmol/L 106 102 R 104 R 101 R  100 R 101 R  CO2 20 - 32 mmol/L 22 29 R 31 R 25 R  29 R 27 R  Calcium 8.6 - 10.3 mg/dL 9.1 9.5 R 9.7 R 9.7 R  9.5 R 9.7 R  Total Protein 6.1 - 8.1  g/dL 6.8 7.1 R 6.7 R  6.8 R 7.4 R 7.4 R  Albumin 3.6 - 5.1 g/dL 4.3        Globulin 1.9 - 3.7 g/dL (calc) 2.5        AG Ratio 1.0 - 2.5 (calc) 1.7        Total Bilirubin 0.2 - 1.2 mg/dL 0.7 1.0 0.7  0.6 1.3 High  1.0  Alkaline phosphatase (APISO) 36 - 130 U/L 45        AST 10 - 40 U/L 21 18 R 15 R  16 R 46 High  R 34 R  ALT 9 - 46 U/L 21 14 R 16 R  14 R 27 R 49 R  Resulting Agency [facility] [place] [facility] [place] [facility] [place] [facility] [place] [facility] [place] [facility] [place] [facility]             EKG   Radiology No results found.  Procedures Procedures (including critical care time)  Medications Ordered in UC Medications  methylPREDNISolone acetate (DEPO-MEDROL) injection 40 mg (has no administration in time range)    Initial Impression / Assessment and Plan / UC Course  I have reviewed the triage vital signs and the nursing notes.  Pertinent labs & imaging results that were available during my care of the patient were reviewed by me and considered in my medical decision making (see chart for details).     Reviewed recent progress notes and labs with patient. Exam consistent with poison ivy dermatitis.  Patient given Depo-Medrol injection in clinic.  He will start prednisone taper tomorrow He may continue OTC treatments as needed such as Benadryl or calamine lotion PCP follow-up if symptoms do not improve ER precautions reviewed and patient verbalized understanding Final Clinical Impressions(s) / UC Diagnoses   Final diagnoses:  Poison ivy dermatitis     Discharge Instructions      You are given a steroid injection in the clinic today.  You may start the prednisone taper tomorrow You may continue over-the-counter creams like calamine lotion as needed.  You may also take Benadryl if needed Follow-up with your PCP if symptoms do not improve Please go to the emergency room if you develop any worsening symptoms    ED Prescriptions      Medication Sig Dispense Auth. Provider   predniSONE (STERAPRED UNI-PAK 21 TAB) 10 MG (21) TBPK tablet Take by mouth daily. Take 6 tabs by mouth daily  for 1 day, then 5 tabs for 1 day, then 4 tabs for 1 day, then 3 tabs for 1 day, 2 tabs for 1 day, then 1 tab by mouth daily for 1 days 21 tablet Radford Pax, NP      PDMP not reviewed this encounter.   Radford Pax, NP 03/25/23 0900

## 2023-03-25 NOTE — Discharge Instructions (Addendum)
You are given a steroid injection in the clinic today.  You may start the prednisone taper tomorrow You may continue over-the-counter creams like calamine lotion as needed.  You may also take Benadryl if needed Follow-up with your PCP if symptoms do not improve Please go to the emergency room if you develop any worsening symptoms

## 2023-04-03 ENCOUNTER — Encounter: Payer: Self-pay | Admitting: Family Medicine

## 2023-04-03 ENCOUNTER — Ambulatory Visit: Payer: BC Managed Care – PPO | Admitting: Family Medicine

## 2023-04-03 VITALS — BP 116/68 | HR 78 | Temp 97.8°F | Ht 69.0 in | Wt 197.4 lb

## 2023-04-03 DIAGNOSIS — L255 Unspecified contact dermatitis due to plants, except food: Secondary | ICD-10-CM | POA: Insufficient documentation

## 2023-04-03 DIAGNOSIS — L237 Allergic contact dermatitis due to plants, except food: Secondary | ICD-10-CM | POA: Diagnosis not present

## 2023-04-03 MED ORDER — PREDNISONE 10 MG PO TABS: ORAL_TABLET | ORAL | 0 refills | Status: DC

## 2023-04-03 NOTE — Patient Instructions (Addendum)
Remember to wash all things that contacted the poison plant oil   Bathe the dog if needed also    Take the prednisone taper as directed Update if not starting to improve in a week or if worsening   Keep the rash clean with soap and water  Try not to scratch   Stay cool  Tepid water instead of hot if you can  Ice packs help also    Zyrtec 10 mg daily at bedtime

## 2023-04-03 NOTE — Assessment & Plan Note (Signed)
2nd occurrence this year (did resolve previously) Reviewed [facility] notes from  5/5- at that time had on arms and trunk, did resolve with medrol injection and dose pack Today not as severe- thinks he has a 2nd exposure / smaller areas on forehead, eyelid and back of neck   Discussed importance of cleaning any tools, clothes, animals that touched the plant/oil  Consider hiring someone to remove from property  Px prednisoene 40 mg taper and discussed possible side effects  Instructed to keep cool  Zyrtec 10 mg daily prn itch  Watch for s/s of infection  ER precautions noted

## 2023-04-03 NOTE — Progress Notes (Signed)
Subjective:    Patient ID: Julian Randolph, male    DOB: January 13, 1976, 47 y.o.   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 cleared shrubs  Saw the plant and tried to avoid  Still got exposed  Thinks this is poison ivy again   Itching on forehead thurs/fri Now R eye lid  Is swollen   Very itchy  No other areas    Very allergic to it  Gets this frequently    Patient Active Problem List   Diagnosis Date Noted   Poison ivy 04/03/2023   Plant dermatitis 04/03/2023   Chronic back pain 12/01/2022   Flank pain 12/01/2022   Chronic abdominal pain 10/10/2018   Hyperlipidemia 06/17/2018   Encounter for annual general medical examination with abnormal findings in adult 06/05/2017   Past Medical History:  Diagnosis Date   Allergy    Bite by nonvenomous insect of hip/thigh/leg/ankle, right, initial encounter 07/13/2021   Chickenpox    Elevated blood pressure reading 06/05/2017   Past Surgical History:  Procedure Laterality Date   COLONOSCOPY WITH PROPOFOL N/A 03/08/2020   Procedure: COLONOSCOPY WITH PROPOFOL;  Surgeon: Toney Reil, MD;  Location: [facility] ENDOSCOPY;  Service: Gastroenterology;  Laterality: N/A;   ESOPHAGOGASTRODUODENOSCOPY (EGD) WITH PROPOFOL N/A 03/08/2020   Procedure: ESOPHAGOGASTRODUODENOSCOPY (EGD) WITH PROPOFOL;  Surgeon: Toney Reil, MD;  Location: [facility] ENDOSCOPY;  Service: Gastroenterology;  Laterality: N/A;   Social History   Tobacco Use   Smoking status: Former     Packs/day: .5    Types: Pipe, Cigarettes   Smokeless tobacco: Former    Types: Associate Professor Use: Never used  Substance Use Topics   Alcohol use: Yes    Alcohol/week: 3.0 standard drinks of alcohol    Types: 3 Standard drinks or equivalent per week    Comment: socially   Drug use: No   Family History  Problem Relation Age of Onset   Healthy Mother    Healthy Father    Colon cancer Maternal Uncle 51   Allergies  Allergen Reactions   Cantaloupe (Diagnostic) Other (See Comments)    Throat swelling and itchy   Current Outpatient Medications on File Prior to Visit  Medication Sig Dispense Refill   methocarbamol (ROBAXIN) 750 MG tablet Take 750 mg by mouth 2 (two) times daily as needed for muscle spasms.     No current facility-administered medications on file prior to visit.     Review of Systems  Constitutional:  Negative for activity change, appetite change, fatigue, fever and unexpected weight change.  HENT:  Negative for congestion, rhinorrhea, sore throat and trouble swallowing.        No mouth or throat swelling   Eyes:  Negative for pain, redness, itching and visual disturbance.       Swollen r upper eyelid due to poison  ivy dermatitis   Respiratory:  Positive for shortness of breath and wheezing. Negative for cough and chest tightness.   Cardiovascular:  Negative for chest pain and palpitations.  Gastrointestinal:  Negative for abdominal pain, blood in stool, constipation, diarrhea and nausea.  Endocrine: Negative for cold intolerance, heat intolerance, polydipsia and polyuria.  Genitourinary:  Negative for difficulty urinating, dysuria, frequency and urgency.  Musculoskeletal:  Negative for arthralgias, joint swelling and myalgias.  Skin:  Positive for rash. Negative for pallor and wound.  Neurological:  Negative for dizziness, tremors, weakness, numbness and headaches.  Hematological:  Negative for adenopathy. Does not bruise/bleed easily.   Psychiatric/Behavioral:  Negative for decreased concentration and dysphoric mood. The patient is not nervous/anxious.        Objective:   Physical Exam Constitutional:      Appearance: Normal appearance. He is normal weight.  HENT:     Head: Normocephalic and atraumatic.     Right Ear: External ear normal.     Left Ear: External ear normal.     Nose: Nose normal.     Mouth/Throat:     Pharynx: Oropharynx is clear.  Eyes:     General:        Right eye: No discharge.        Left eye: No discharge.     Extraocular Movements: Extraocular movements intact.     Conjunctiva/sclera: Conjunctivae normal.     Pupils: Pupils are equal, round, and reactive to light.     Comments: R eyelid is swollen with dermatitis (upper) No conj changes  No discharge Normal movement of eye  Grossly normal vision    Cardiovascular:     Rate and Rhythm: Normal rate and regular rhythm.     Heart sounds: Normal heart sounds.  Pulmonary:     Effort: No respiratory distress.     Breath sounds: Normal breath sounds. No wheezing.  Musculoskeletal:     Cervical back: Normal range of motion.  Lymphadenopathy:     Cervical: No cervical adenopathy.  Skin:    General: Skin is warm and dry.     Findings: Rash present.     Comments: Clusters of erythema with tiny vesicles on L forehead/ back of neck and R eyelid  Eyelid is swollen    Neurological:     Mental Status: He is alert.  Psychiatric:        Mood and Affect: Mood normal.           Assessment & Plan:   Problem List Items Addressed This Visit       Musculoskeletal and Integument   Plant dermatitis    2nd occurrence this year (did resolve previously) Reviewed [facility] from  5/5- at that time had on arms and trunk, did resolve with medrol injection and dose pack Today not as severe- thinks he has a 2nd exposure / smaller areas on forehead, eyelid and back of neck   Discussed importance of cleaning any tools, clothes,  animals that touched the plant/oil  Consider hiring someone to remove from property  Px prednisoene 40 mg taper and discussed possible side effects  Instructed to keep cool  Zyrtec 10 mg daily prn itch  Watch for s/s of infection  ER precautions noted        Poison ivy - Primary

## 2024-02-28 DIAGNOSIS — M4722 Other spondylosis with radiculopathy, cervical region: Secondary | ICD-10-CM | POA: Diagnosis not present

## 2024-03-11 ENCOUNTER — Ambulatory Visit
Admission: EM | Admit: 2024-03-11 | Discharge: 2024-03-11 | Disposition: A | Discharge status: Home / Self Care | Attending: Emergency Medicine | Admitting: Emergency Medicine

## 2024-03-11 ENCOUNTER — Other Ambulatory Visit: Payer: Self-pay

## 2024-03-11 ENCOUNTER — Encounter: Payer: Self-pay | Admitting: Emergency Medicine

## 2024-03-11 DIAGNOSIS — L237 Allergic contact dermatitis due to plants, except food: Secondary | ICD-10-CM

## 2024-03-11 MED ORDER — PREDNISONE 10 MG (21) PO TBPK: ORAL_TABLET | Freq: Every day | ORAL | 0 refills | Status: AC

## 2024-03-11 NOTE — ED Provider Notes (Signed)
 Julian Randolph    CSN: [id number] Arrival date & time: 03/11/24  1525      History   Chief Complaint Chief Complaint  Patient presents with   Rash    HPI Julian Randolph is a 48 y.o. male.   Patient presents for evaluation of erythematous pruritic rash present to the left leg, right arm, neck and shoulders present for 4 days.  Initial rash to the leg began several weeks ago.  Has not attempted treatment, endorses occurs typically once a year and progressively worsens with time.  Denies drainage, fever.  Denies changes in toiletries, diet or recent travel.  No close contact has similar symptoms.  Past Medical History:  Diagnosis Date   Allergy    Bite by nonvenomous insect of hip/thigh/leg/ankle, right, initial encounter 07/13/2021   Chickenpox    Elevated blood pressure reading 06/05/2017    Patient Active Problem List   Diagnosis Date Noted   Poison ivy 04/03/2023   Plant dermatitis 04/03/2023   Chronic back pain 12/01/2022   Flank pain 12/01/2022   Chronic abdominal pain 10/10/2018   Hyperlipidemia 06/17/2018   Encounter for annual general medical examination with abnormal findings in adult 06/05/2017    Past Surgical History:  Procedure Laterality Date   COLONOSCOPY WITH PROPOFOL  N/A 03/08/2020   Procedure: COLONOSCOPY WITH PROPOFOL ;  Surgeon: Selena Daily, MD;  Location: [facility] ENDOSCOPY;  Service: Gastroenterology;  Laterality: N/A;   ESOPHAGOGASTRODUODENOSCOPY (EGD) WITH PROPOFOL  N/A 03/08/2020   Procedure: ESOPHAGOGASTRODUODENOSCOPY (EGD) WITH PROPOFOL ;  Surgeon: Selena Daily, MD;  Location: [facility] ENDOSCOPY;  Service: Gastroenterology;  Laterality: N/A;       Home Medications    Prior to Admission medications   Medication Sig Start Date End Date Taking? Authorizing Provider  predniSONE  (STERAPRED UNI-PAK 21 TAB) 10 MG (21) TBPK tablet Take by mouth daily. Take 6 tabs by mouth daily  for 2 days, then 5 tabs for 2 days, then 4 tabs for 2  days, then 3 tabs for 2 days, 2 tabs for 2 days, then 1 tab by mouth daily for 2 days 03/11/24  Yes Rindy Kollman R, NP  methocarbamol (ROBAXIN) 750 MG tablet Take 750 mg by mouth 2 (two) times daily as needed for muscle spasms.    [provider]    Family History Family History  Problem Relation Age of Onset   Healthy Mother    Healthy Father    Colon cancer Maternal Uncle 26    Social History Social History   Tobacco Use   Smoking status: Former    Current packs/day: 0.50    Types: Pipe, Cigarettes   Smokeless tobacco: Former    Types: Engineer, drilling   Vaping status: Never Used  Substance Use Topics   Alcohol use: Yes    Alcohol/week: 3.0 standard drinks of alcohol    Types: 3 Standard drinks or equivalent per week    Comment: socially   Drug use: No     Allergies   Cantaloupe (diagnostic)   Review of Systems Review of Systems  Skin:  Positive for rash.     Physical Exam Triage Vital Signs ED Triage Vitals  Encounter Vitals Group     BP 03/11/24 1552 133/79     Systolic BP Percentile --      Diastolic BP Percentile --      Pulse Rate 03/11/24 1552 87     Resp 03/11/24 1552 16     Temp 03/11/24 1552 98 F (  36.7 C)     Temp Source 03/11/24 1552 Temporal     SpO2 03/11/24 1552 96 %     Weight --      Height --      Head Circumference --      Peak Flow --      Pain Score 03/11/24 1553 0     Pain Loc --      Pain Education --      Exclude from Growth Chart --    No data found.  Updated Vital Signs BP 133/79 (BP Location: Left Arm)   Pulse 87   Temp 98 F (36.7 C) (Temporal)   Resp 16   SpO2 96%   Visual Acuity Right Eye Distance:   Left Eye Distance:   Bilateral Distance:    Right Eye Near:   Left Eye Near:    Bilateral Near:     Physical Exam Constitutional:      Appearance: Normal appearance.  Eyes:     Extraocular Movements: Extraocular movements intact.  Pulmonary:     Effort: Pulmonary effort is normal.  Skin:     Comments: Erythematous clusters of blisters present to the flexor of the right arm, the posterior of the left lower leg, the posterior neck and the bilateral upper shoulders  Neurological:     Mental Status: He is alert and oriented to person, place, and time. Mental status is at baseline.      UC Treatments / Results  Labs (all labs ordered are listed, but only abnormal results are displayed) Labs Reviewed - No data to display  EKG   Radiology No results found.  Procedures Procedures (including critical care time)  Medications Ordered in UC Medications - No data to display  Initial Impression / Assessment and Plan / UC Course  I have reviewed the triage vital signs and the nursing notes.  Pertinent labs & imaging results that were available during my care of the patient were reviewed by me and considered in my medical decision making (see chart for details).  Poison ivy dermatitis  Presentation of rash is inflammatory, no signs of infection, known exposure to poison ivy, declined steroid injection in clinic, prescribed prednisone  taper and recommended oral and topical antihistamines for management of pruritus, recommended additional supportive measures and advised follow-up if symptoms do not improve Final Clinical Impressions(s) / UC Diagnoses   Final diagnoses:  Poison ivy dermatitis     Discharge Instructions      Today you are being treated for the poison ivy/oak rash  Starting tomorrow take prednisone  every morning with food as directed to help reduce the inflammatory process that occurs with this rash which will help minimize your itching as well as begin to clear  You may continue use of topical calamine or Benadryl cream to help manage itching, you may also continue oral Benadryl  Please avoid long exposures to heat such as a hot steamy shower or being outside as this may cause further irritation to your rash  You may follow-up with his urgent care as  needed if symptoms persist or worsen    ED Prescriptions     Medication Sig Dispense Auth. Provider   predniSONE  (STERAPRED UNI-PAK 21 TAB) 10 MG (21) TBPK tablet Take by mouth daily. Take 6 tabs by mouth daily  for 2 days, then 5 tabs for 2 days, then 4 tabs for 2 days, then 3 tabs for 2 days, 2 tabs for 2 days, then 1 tab by  mouth daily for 2 days 42 tablet Gaspard Isbell, Maybelle Spatz, NP      PDMP not reviewed this encounter.   Reena Canning, [place] 03/11/24 [phone]

## 2024-03-11 NOTE — ED Triage Notes (Signed)
 Patient presents to [facility] for evaluation of rash to back of left calf,  and now on right [facility] area.  Says he knows he has some in his yard, and he is getting exposed.

## 2024-03-11 NOTE — Discharge Instructions (Signed)
 Today you are being treated for the poison ivy/oak rash  Starting tomorrow take prednisone  every morning with food as directed to help reduce the inflammatory process that occurs with this rash which will help minimize your itching as well as begin to clear  You may continue use of topical calamine or Benadryl cream to help manage itching, you may also continue oral Benadryl  Please avoid long exposures to heat such as a hot steamy shower or being outside as this may cause further irritation to your rash  You may follow-up with his urgent care as needed if symptoms persist or worsen
